# Patient Record
Sex: Female | Born: 1990
Health system: Southern US, Community
[De-identification: ages and names within clinical notes are randomized; demographics above are authoritative.]

## PROBLEM LIST (undated history)

## (undated) DIAGNOSIS — IMO0002 Reserved for concepts with insufficient information to code with codable children: Secondary | ICD-10-CM

## (undated) DIAGNOSIS — K219 Gastro-esophageal reflux disease without esophagitis: Secondary | ICD-10-CM

## (undated) DIAGNOSIS — I7300 Raynaud's syndrome without gangrene: Secondary | ICD-10-CM

## (undated) DIAGNOSIS — N390 Urinary tract infection, site not specified: Secondary | ICD-10-CM

## (undated) DIAGNOSIS — F419 Anxiety disorder, unspecified: Secondary | ICD-10-CM

## (undated) DIAGNOSIS — D839 Common variable immunodeficiency, unspecified: Secondary | ICD-10-CM

## (undated) DIAGNOSIS — J45909 Unspecified asthma, uncomplicated: Secondary | ICD-10-CM

## (undated) DIAGNOSIS — D693 Immune thrombocytopenic purpura: Secondary | ICD-10-CM

## (undated) DIAGNOSIS — T7840XA Allergy, unspecified, initial encounter: Secondary | ICD-10-CM

## (undated) DIAGNOSIS — M1990 Unspecified osteoarthritis, unspecified site: Secondary | ICD-10-CM

## (undated) DIAGNOSIS — K921 Melena: Secondary | ICD-10-CM

## (undated) DIAGNOSIS — B019 Varicella without complication: Secondary | ICD-10-CM

## (undated) DIAGNOSIS — J069 Acute upper respiratory infection, unspecified: Secondary | ICD-10-CM

## (undated) DIAGNOSIS — M329 Systemic lupus erythematosus, unspecified: Secondary | ICD-10-CM

## (undated) HISTORY — DX: Raynaud's syndrome without gangrene: I73.00

## (undated) HISTORY — DX: Common variable immunodeficiency, unspecified: D83.9

## (undated) HISTORY — DX: Gastro-esophageal reflux disease without esophagitis: K21.9

## (undated) HISTORY — DX: Varicella without complication: B01.9

## (undated) HISTORY — DX: Systemic lupus erythematosus, unspecified: M32.9

## (undated) HISTORY — DX: Immune thrombocytopenic purpura: D69.3

## (undated) HISTORY — DX: Reserved for concepts with insufficient information to code with codable children: IMO0002

## (undated) HISTORY — DX: Melena: K92.1

## (undated) HISTORY — DX: Allergy, unspecified, initial encounter: T78.40XA

## (undated) HISTORY — DX: Urinary tract infection, site not specified: N39.0

## (undated) HISTORY — DX: Unspecified osteoarthritis, unspecified site: M19.90

## (undated) HISTORY — DX: Acute upper respiratory infection, unspecified: J06.9

## (undated) HISTORY — DX: Unspecified asthma, uncomplicated: J45.909

## (undated) HISTORY — DX: Anxiety disorder, unspecified: F41.9

---
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
## 2011-01-03 DIAGNOSIS — M3219 Other organ or system involvement in systemic lupus erythematosus: Secondary | ICD-10-CM

## 2011-01-03 DIAGNOSIS — G053 Encephalitis and encephalomyelitis in diseases classified elsewhere: Secondary | ICD-10-CM

## 2011-01-03 HISTORY — DX: Other organ or system involvement in systemic lupus erythematosus: M32.19

## 2011-01-03 HISTORY — DX: Encephalitis and encephalomyelitis in diseases classified elsewhere: G05.3

## 2012-01-03 DIAGNOSIS — Z9484 Stem cells transplant status: Secondary | ICD-10-CM

## 2012-01-03 HISTORY — DX: Stem cells transplant status: Z94.84

## 2016-01-03 DIAGNOSIS — M869 Osteomyelitis, unspecified: Secondary | ICD-10-CM

## 2016-01-03 HISTORY — DX: Osteomyelitis, unspecified: M86.9

## 2017-01-02 HISTORY — PX: ORIF ANKLE FRACTURE: SUR919

## 2018-01-02 HISTORY — PX: HARDWARE REMOVAL: SHX979

## 2018-01-02 HISTORY — PX: CYST EXCISION: SHX5701

## 2018-02-27 DIAGNOSIS — K635 Polyp of colon: Secondary | ICD-10-CM | POA: Insufficient documentation

## 2018-02-27 HISTORY — DX: Polyp of colon: K63.5

## 2018-02-28 DIAGNOSIS — K635 Polyp of colon: Secondary | ICD-10-CM

## 2018-02-28 DIAGNOSIS — K449 Diaphragmatic hernia without obstruction or gangrene: Secondary | ICD-10-CM

## 2018-02-28 HISTORY — PX: ESOPHAGOGASTRODUODENOSCOPY: SHX1529

## 2018-02-28 HISTORY — DX: Diaphragmatic hernia without obstruction or gangrene: K44.9

## 2018-02-28 HISTORY — PX: COLONOSCOPY: SHX174

## 2018-02-28 HISTORY — DX: Polyp of colon: K63.5

## 2019-06-25 NOTE — Progress Notes (Signed)
New Patient Note  RE: Shirley Carr MRN: [id number] DOB: 1990/08/09 Date of Office Visit: 06/26/2019  Referring provider: No ref. provider found Primary care provider: Patient, No Pcp Per  Chief Complaint: Establish Care  History of Present Illness: I had the pleasure of seeing Shirley Carr for initial evaluation at the [facility] [place] on 06/26/2019. She is a 29 y.o. female, who is self-referred here for the evaluation of establish care. Up to date with COVID-19 vaccine: no  Patient was being followed by allergy/immunology in [place] for CVID and currently on Hizentra weekly injections 13gm for the past 6 months with good benefit. Last injection on 06/22/2019 and has no medications left. No reactions and administers at home.   Patient has history multiple infections including sinus infection, pneumonia, ear infections, meningitis. Denies any GI infections, skin infections/abscesses. Patient has no history of opportunistic infections including fungal infections, viral infections.  Patient labs show immunoglobulin levels  On 07/09/2018 IgG 459, IgA <40, IgM 28. Patient reports 0 antibiotic use in the last 12 months and 1 hospital admissions for a foot infection in September 2018. Patient does not have any secondary causes of immunodeficiency including chronic steroid use, diabetes mellitus, protein losing enteropathy, renal or hepatic dysfunction, history of cancer or irradiation or history of HIV, hepatitis B or C.  Class switched memory B cell (CD27+/IgD-/IgM-) low.  Repeat IgG in February 2021 - IgG 875.   Patient has lupus and was treated with rituximab in 2012. Patient is on plaquenil 436m now with good control. Scheduled to see rheumatology in [place] No history of blood clots.   CT chest in January 2020 which was normal - in process of obtaining records.  Spirometry end of last year was normal.  In February 2020 patient had EGD and colonoscopy and was found to have  precancerous polyps.  This was done due to blood in the stool.  Other medical history includes ITP in 2011 treated with steroids, IVIG, Rituximab x 4 weeks.   Rhinitis: Patient was on allergy injections for about 2 months but then recent testing in 2020 showed no environmental allergy sensitivities. She reports symptoms of rhinitis and PND mainly in the mornings. She has used zyrtec with some improvement in symptoms. Previous work up includes: skin prick testing in 2020 and immunocap which was all negative.  Assessment and Plan: KYenyis a 29y.o. female with: CVID (common variable immunodeficiency) ([place] Diagnosed with CVID in 2020 and has been on Hizentra 13gm weekly for the past 6 months with good benefit. Initially had some fatigue and headaches. Last injection on 06/22/2019. Pretreatment levels on 07/09/2018 IgG 459, IgA <40, IgM 28. History of frequent sinusitis, 1 pneumonia, ear infections, meningitis, foot infection requiring hospitalization. 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 CT chest in January 2020 - in process of obtaining records. History of ITP in 20211, diagnosed with lupus and on Plaquenil. No history of blood clots. Last IgG in February 2021 was 875.   Continue with Hizentra 13gm weekly injections - will start [place].   Discussed the risks and benefits of IgG replacement therapy.   If interested in the future we can also try to switch over to less frequent SQ injections such as Hyquvia.   Get bloodwork and will need bloodwork every 3-4 months to monitor immunoglobulin levels, CBC diff, CMP, ESR and CRP.   Keep track of infections.  History of asthma History of asthma and last albuterol use in 2013.  Today's  spirometry was normal.  Monitor symptoms.   Chronic rhinitis PND symptoms in the mornings. Patient had skin prick testing and bloodwork in 2020 which was negative to environmental allergens. Interestingly though she had positive testing beforehand and was  on immunotherapy for 2 short months.   May use over the counter antihistamines such as Zyrtec (cetirizine), Claritin (loratadine), Allegra (fexofenadine), or Xyzal (levocetirizine) daily as needed.  If still having PND, then will start a nasal spray next.  May use Nasacort 1 spray per nostril 1-2 times a day as needed. Sample given.   Return in about 6 months (around 12/26/2019).  Lab Orders     CBC with Differential/Platelet     Comprehensive metabolic panel     IgG, IgA, IgM     Sedimentation rate     C-reactive protein  Other allergy screening: Asthma: yes  No inhaler use since 2013 Food allergy: no Medication allergy: yes  Pseudoephedrine - joint swelling Hymenoptera allergy: no Urticaria: no Eczema:no  Diagnostics: Spirometry:  Tracings reviewed. Her effort: Good reproducible efforts. FVC: 4.55L FEV1: 3.68L, 99% predicted FEV1/FVC ratio: 81% Interpretation: Spirometry consistent with normal pattern.  Please see scanned spirometry results for details.   Past Medical History: Patient Active Problem List   Diagnosis Date Noted  . CVID (common variable immunodeficiency) ([place]) 06/26/2019  . Chronic rhinitis 06/26/2019  . History of asthma 06/26/2019   Past Medical History:  Diagnosis Date  . Asthma   . Recurrent upper respiratory infection (URI)    Past Surgical History: History reviewed. No pertinent surgical history. Medication List:  Current Outpatient Medications  Medication Sig Dispense Refill  . cetirizine (ZYRTEC) 10 MG tablet Take 10 mg by mouth daily.    . clobetasol (TEMOVATE) 0.05 % external solution Apply topically.    . hydroxychloroquine (PLAQUENIL) 200 MG tablet Take 400 mg by mouth daily.    . Immune Globulin, Human, (HIZENTRA) 1 GM/5ML SOSY Inject 13 g into the skin once a week.    . Norgestimate-Ethinyl Estradiol Triphasic 0.18/0.215/0.25 MG-35 MCG tablet Take 1 tablet by mouth daily.    Marland Kitchen omeprazole (PRILOSEC) 20 MG capsule Take 20 mg by  mouth daily.     No current facility-administered medications for this visit.   Allergies: Allergies  Allergen Reactions  . Pseudoephedrine    Social History: Social History   Socioeconomic History  . Marital status: Single    Spouse name: Not on file  . Number of children: Not on file  . Years of education: Not on file  . Highest education level: Not on file  Occupational History  . Not on file  Tobacco Use  . Smoking status: Never Smoker  . Smokeless tobacco: Never Used  Substance and Sexual Activity  . Alcohol use: Yes  . Drug use: Never  . Sexual activity: Not on file  Other Topics Concern  . Not on file  Social History Narrative  . Not on file   Social Determinants of Health   Financial Resource Strain:   . Difficulty of Paying Living Expenses:   Food Insecurity:   . Worried About Charity fundraiser in the Last Year:   . Arboriculturist in the Last Year:   Transportation Needs:   . Film/video editor (Medical):   Marland Kitchen Lack of Transportation (Non-Medical):   Physical Activity:   . Days of Exercise per Week:   . Minutes of Exercise per Session:   Stress:   . Feeling of Stress :  Social Connections:   . Frequency of Communication with Friends and Family:   . Frequency of Social Gatherings with Friends and Family:   . Attends Religious Services:   . Active Member of Clubs or Organizations:   . Attends Archivist Meetings:   Marland Kitchen Marital Status:    Lives in an apartment. Smoking: denies Occupation: loan closer  Environmental HistoryFreight forwarder in the house: no Carpet in the family room: no Carpet in the bedroom: yes Heating: electric Cooling: central Pet: no  Family History: Family History  Problem Relation Age of Onset  . Urticaria Mother   . Eczema Brother   . Allergic rhinitis Neg Hx   . Asthma Neg Hx    Immunodeficiency - No Brother had adolescent RA.  Review of Systems  Constitutional: Negative for appetite  change, chills, fever and unexpected weight change.  HENT: Negative for congestion and rhinorrhea.   Eyes: Negative for itching.  Respiratory: Negative for cough, chest tightness, shortness of breath and wheezing.   Cardiovascular: Negative for chest pain.  Gastrointestinal: Negative for abdominal pain.  Genitourinary: Negative for difficulty urinating.  Skin: Negative for rash.  Neurological: Negative for headaches.   Objective: BP 126/90 (BP Location: Left Arm, Patient Position: Sitting, Cuff Size: Large)   Pulse 88   Temp 97.6 F (36.4 C) (Temporal)   Resp 18   Ht 5' 8.54" (1.741 m)   Wt 299 lb (135.6 kg)   SpO2 100%   BMI 44.75 kg/m  Body mass index is 44.75 kg/m. Physical Exam Vitals and nursing note reviewed.  Constitutional:      Appearance: Normal appearance. She is well-developed.  HENT:     Head: Normocephalic and atraumatic.     Right Ear: External ear normal.     Left Ear: External ear normal.     Nose: Nose normal.     Mouth/Throat:     Mouth: Mucous membranes are moist.     Pharynx: Oropharynx is clear.  Eyes:     Conjunctiva/sclera: Conjunctivae normal.  Cardiovascular:     Rate and Rhythm: Normal rate and regular rhythm.     Heart sounds: Normal heart sounds. No murmur heard.  No friction rub. No gallop.   Pulmonary:     Effort: Pulmonary effort is normal.     Breath sounds: Normal breath sounds. No wheezing, rhonchi or rales.  Abdominal:     Palpations: Abdomen is soft.  Musculoskeletal:     Cervical back: Neck supple.  Skin:    General: Skin is warm.     Findings: No rash.  Neurological:     Mental Status: She is alert and oriented to person, place, and time.  Psychiatric:        Behavior: Behavior normal.    The plan was reviewed with the patient/family, and all questions/concerned were addressed.  It was my pleasure to see Eric today and participate in her care. Please feel free to contact me with any questions or  concerns.  Sincerely,  Rexene Alberts, DO Allergy & Immunology  [facility] office: [phone] [facility] office: [place] office: [phone]

## 2019-06-26 ENCOUNTER — Encounter: Payer: Self-pay | Admitting: Allergy

## 2019-06-26 ENCOUNTER — Ambulatory Visit (INDEPENDENT_AMBULATORY_CARE_PROVIDER_SITE_OTHER): Payer: BC Managed Care – PPO | Admitting: Allergy

## 2019-06-26 ENCOUNTER — Other Ambulatory Visit: Payer: Self-pay

## 2019-06-26 VITALS — BP 126/90 | HR 88 | Temp 97.6°F | Resp 18 | Ht 68.54 in | Wt 299.0 lb

## 2019-06-26 DIAGNOSIS — Z8709 Personal history of other diseases of the respiratory system: Secondary | ICD-10-CM | POA: Insufficient documentation

## 2019-06-26 DIAGNOSIS — J310 Chronic rhinitis: Secondary | ICD-10-CM | POA: Insufficient documentation

## 2019-06-26 DIAGNOSIS — D839 Common variable immunodeficiency, unspecified: Secondary | ICD-10-CM | POA: Diagnosis not present

## 2019-06-26 HISTORY — DX: Personal history of other diseases of the respiratory system: Z87.09

## 2019-06-26 NOTE — Patient Instructions (Addendum)
CVID  Continue with Hizentra 13gm weekly injections.  Tammy will get in contact with you regarding this.  Get bloodwork.  Keep track of infections.  Read about Hyquvia or Cuvitru which are different subcutaneous IgG replacement products.  I will need the results of your CT chest.  Rhinitis:  May use over the counter antihistamines such as Zyrtec (cetirizine), Claritin (loratadine), Allegra (fexofenadine), or Xyzal (levocetirizine) daily as needed.  If still having PND, then will start a nasal spray next.  May use Nasacort 1 spray per nostril 1-2 times a day as needed. Sample given.   Follow up in 6 months or sooner if needed.  Please establish care with PCP.

## 2019-06-26 NOTE — Assessment & Plan Note (Signed)
History of asthma and last albuterol use in 2013.  Today's spirometry was normal.  Monitor symptoms.

## 2019-06-26 NOTE — Assessment & Plan Note (Signed)
PND symptoms in the mornings. Patient had skin prick testing and bloodwork in 2020 which was negative to environmental allergens. Interestingly though she had positive testing beforehand and was on immunotherapy for 2 short months.   May use over the counter antihistamines such as Zyrtec (cetirizine), Claritin (loratadine), Allegra (fexofenadine), or Xyzal (levocetirizine) daily as needed.  If still having PND, then will start a nasal spray next.  May use Nasacort 1 spray per nostril 1-2 times a day as needed. Sample given.

## 2019-06-26 NOTE — Assessment & Plan Note (Addendum)
Diagnosed with CVID in 2020 and has been on Hizentra 13gm weekly for the past 6 months with good benefit. Initially had some fatigue and headaches. Last injection on 06/22/2019. Pretreatment levels on 07/09/2018 IgG 459, IgA <40, IgM 28. History of frequent sinusitis, 1 pneumonia, ear infections, meningitis, foot infection requiring hospitalization. 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 CT chest in January 2020 - in process of obtaining records. History of ITP in 20211, diagnosed with lupus and on Plaquenil. No history of blood clots. Last IgG in February 2021 was 875.   Continue with Hizentra 13gm weekly injections - will start [place].   Discussed the risks and benefits of IgG replacement therapy.   If interested in the future we can also try to switch over to less frequent SQ injections such as Hyquvia.   Get bloodwork and will need bloodwork every 3-4 months to monitor immunoglobulin levels, CBC diff, CMP, ESR and CRP.   Keep track of infections.

## 2019-06-30 ENCOUNTER — Telehealth: Payer: Self-pay | Admitting: *Deleted

## 2019-06-30 NOTE — Telephone Encounter (Signed)
Called patient and discussed approval and submit to another pharmacy for her Hizentra. Will be in touch regarding process

## 2019-06-30 NOTE — Telephone Encounter (Signed)
-----   Message from Garnet Sierras, DO sent at 06/26/2019  1:23 PM EDT ----- Patient transferring care. Receiving at home SQ hizentra 13gm every 7 days for CVID. Last injection was on 06/22/2019. Pretreatment IgG on 07/09/2018 459, IgA <40, IgM 28.  Patients needs new PA to start Hizentra with current dose. Please submit. Thank you!

## 2019-07-02 LAB — CBC WITH DIFFERENTIAL/PLATELET
Basophils Absolute: 0 10*3/uL (ref 0.0–0.2)
Basos: 0 %
EOS (ABSOLUTE): 0.1 10*3/uL (ref 0.0–0.4)
Eos: 1 %
Hematocrit: 42.4 % (ref 34.0–46.6)
Hemoglobin: 14.2 g/dL (ref 11.1–15.9)
Immature Grans (Abs): 0 10*3/uL (ref 0.0–0.1)
Immature Granulocytes: 0 %
Lymphocytes Absolute: 1.3 10*3/uL (ref 0.7–3.1)
Lymphs: 22 %
MCH: 28 pg (ref 26.6–33.0)
MCHC: 33.5 g/dL (ref 31.5–35.7)
MCV: 84 fL (ref 79–97)
Monocytes Absolute: 0.3 10*3/uL (ref 0.1–0.9)
Monocytes: 6 %
Neutrophils Absolute: 4 10*3/uL (ref 1.4–7.0)
Neutrophils: 71 %
Platelets: 250 10*3/uL (ref 150–450)
RBC: 5.08 x10E6/uL (ref 3.77–5.28)
RDW: 13.1 % (ref 11.7–15.4)
WBC: 5.7 10*3/uL (ref 3.4–10.8)

## 2019-07-02 LAB — COMPREHENSIVE METABOLIC PANEL
ALT: 16 IU/L (ref 0–32)
AST: 20 IU/L (ref 0–40)
Albumin/Globulin Ratio: 1.7 (ref 1.2–2.2)
Albumin: 4.3 g/dL (ref 3.9–5.0)
Alkaline Phosphatase: 87 IU/L (ref 48–121)
BUN/Creatinine Ratio: 9 (ref 9–23)
BUN: 9 mg/dL (ref 6–20)
Bilirubin Total: 0.3 mg/dL (ref 0.0–1.2)
CO2: 23 mmol/L (ref 20–29)
Calcium: 9.4 mg/dL (ref 8.7–10.2)
Chloride: 101 mmol/L (ref 96–106)
Creatinine, Ser: 1.01 mg/dL — ABNORMAL HIGH (ref 0.57–1.00)
GFR calc Af Amer: 88 mL/min/{1.73_m2} (ref >59)
GFR calc non Af Amer: 76 mL/min/{1.73_m2} (ref >59)
Globulin, Total: 2.6 g/dL (ref 1.5–4.5)
Glucose: 122 mg/dL — ABNORMAL HIGH (ref 65–99)
Potassium: 4.2 mmol/L (ref 3.5–5.2)
Sodium: 137 mmol/L (ref 134–144)
Total Protein: 6.9 g/dL (ref 6.0–8.5)

## 2019-07-02 LAB — IGG, IGA, IGM
IgA/Immunoglobulin A, Serum: 31 mg/dL — ABNORMAL LOW (ref 87–352)
IgG (Immunoglobin G), Serum: 1005 mg/dL (ref 586–1602)
IgM (Immunoglobulin M), Srm: 20 mg/dL — ABNORMAL LOW (ref 26–217)

## 2019-07-02 LAB — SEDIMENTATION RATE: Sed Rate: 14 mm/hr (ref 0–32)

## 2019-07-02 LAB — C-REACTIVE PROTEIN: CRP: 24 mg/L — ABNORMAL HIGH (ref 0–10)

## 2019-07-03 NOTE — Addendum Note (Signed)
Addended by: Garnet Sierras on: 07/03/2019 02:24 PM   Modules accepted: Orders

## 2019-07-13 DIAGNOSIS — D839 Common variable immunodeficiency, unspecified: Secondary | ICD-10-CM | POA: Diagnosis not present

## 2019-07-13 DIAGNOSIS — L03113 Cellulitis of right upper limb: Secondary | ICD-10-CM | POA: Diagnosis not present

## 2019-07-15 ENCOUNTER — Encounter: Payer: Self-pay | Admitting: Allergy

## 2019-07-15 NOTE — Progress Notes (Signed)
Received CD rom and printout of recent imaging.  CT neck 05/14/2018 - unremarkable. CXR 04/30/2018 - no acute findings

## 2019-07-21 ENCOUNTER — Other Ambulatory Visit: Payer: Self-pay | Admitting: Allergy

## 2019-07-22 DIAGNOSIS — L03113 Cellulitis of right upper limb: Secondary | ICD-10-CM | POA: Diagnosis not present

## 2019-08-10 DIAGNOSIS — D839 Common variable immunodeficiency, unspecified: Secondary | ICD-10-CM | POA: Diagnosis not present

## 2019-09-11 DIAGNOSIS — D839 Common variable immunodeficiency, unspecified: Secondary | ICD-10-CM | POA: Diagnosis not present

## 2019-09-16 DIAGNOSIS — D801 Nonfamilial hypogammaglobulinemia: Secondary | ICD-10-CM | POA: Diagnosis not present

## 2019-09-16 DIAGNOSIS — M329 Systemic lupus erythematosus, unspecified: Secondary | ICD-10-CM | POA: Diagnosis not present

## 2019-09-18 DIAGNOSIS — Z79899 Other long term (current) drug therapy: Secondary | ICD-10-CM | POA: Diagnosis not present

## 2019-09-18 DIAGNOSIS — M069 Rheumatoid arthritis, unspecified: Secondary | ICD-10-CM | POA: Diagnosis not present

## 2019-10-12 DIAGNOSIS — D839 Common variable immunodeficiency, unspecified: Secondary | ICD-10-CM | POA: Diagnosis not present

## 2019-10-26 DIAGNOSIS — D839 Common variable immunodeficiency, unspecified: Secondary | ICD-10-CM | POA: Diagnosis not present

## 2019-11-02 DIAGNOSIS — D839 Common variable immunodeficiency, unspecified: Secondary | ICD-10-CM | POA: Diagnosis not present

## 2019-11-09 DIAGNOSIS — D839 Common variable immunodeficiency, unspecified: Secondary | ICD-10-CM | POA: Diagnosis not present

## 2019-11-23 DIAGNOSIS — D839 Common variable immunodeficiency, unspecified: Secondary | ICD-10-CM | POA: Diagnosis not present

## 2019-11-30 DIAGNOSIS — D839 Common variable immunodeficiency, unspecified: Secondary | ICD-10-CM | POA: Diagnosis not present

## 2019-12-08 DIAGNOSIS — D839 Common variable immunodeficiency, unspecified: Secondary | ICD-10-CM | POA: Diagnosis not present

## 2019-12-09 LAB — IGG, IGA, IGM
IgA/Immunoglobulin A, Serum: 30 mg/dL — ABNORMAL LOW (ref 87–352)
IgG (Immunoglobin G), Serum: 1102 mg/dL (ref 586–1602)
IgM (Immunoglobulin M), Srm: 22 mg/dL — ABNORMAL LOW (ref 26–217)

## 2019-12-09 LAB — CBC WITH DIFFERENTIAL/PLATELET
Basophils Absolute: 0 10*3/uL (ref 0.0–0.2)
Basos: 1 %
EOS (ABSOLUTE): 0.1 10*3/uL (ref 0.0–0.4)
Eos: 1 %
Hematocrit: 42.7 % (ref 34.0–46.6)
Hemoglobin: 14.4 g/dL (ref 11.1–15.9)
Immature Grans (Abs): 0 10*3/uL (ref 0.0–0.1)
Immature Granulocytes: 0 %
Lymphocytes Absolute: 1.9 10*3/uL (ref 0.7–3.1)
Lymphs: 25 %
MCH: 28.4 pg (ref 26.6–33.0)
MCHC: 33.7 g/dL (ref 31.5–35.7)
MCV: 84 fL (ref 79–97)
Monocytes Absolute: 0.6 10*3/uL (ref 0.1–0.9)
Monocytes: 9 %
Neutrophils Absolute: 4.8 10*3/uL (ref 1.4–7.0)
Neutrophils: 64 %
Platelets: 287 10*3/uL (ref 150–450)
RBC: 5.07 x10E6/uL (ref 3.77–5.28)
RDW: 12.7 % (ref 11.7–15.4)
WBC: 7.4 10*3/uL (ref 3.4–10.8)

## 2019-12-09 LAB — C-REACTIVE PROTEIN: CRP: 17 mg/L — ABNORMAL HIGH (ref 0–10)

## 2019-12-09 LAB — SEDIMENTATION RATE: Sed Rate: 6 mm/h (ref 0–32)

## 2019-12-10 DIAGNOSIS — D839 Common variable immunodeficiency, unspecified: Secondary | ICD-10-CM | POA: Diagnosis not present

## 2019-12-24 DIAGNOSIS — D839 Common variable immunodeficiency, unspecified: Secondary | ICD-10-CM | POA: Diagnosis not present

## 2019-12-30 ENCOUNTER — Ambulatory Visit (INDEPENDENT_AMBULATORY_CARE_PROVIDER_SITE_OTHER): Payer: BC Managed Care – PPO | Admitting: Allergy

## 2019-12-30 ENCOUNTER — Other Ambulatory Visit: Payer: Self-pay

## 2019-12-30 ENCOUNTER — Encounter: Payer: Self-pay | Admitting: Allergy

## 2019-12-30 VITALS — BP 110/80 | HR 101 | Temp 97.9°F | Resp 20

## 2019-12-30 DIAGNOSIS — Z8709 Personal history of other diseases of the respiratory system: Secondary | ICD-10-CM

## 2019-12-30 DIAGNOSIS — D839 Common variable immunodeficiency, unspecified: Secondary | ICD-10-CM | POA: Diagnosis not present

## 2019-12-30 DIAGNOSIS — J310 Chronic rhinitis: Secondary | ICD-10-CM | POA: Diagnosis not present

## 2019-12-30 MED ORDER — AZELASTINE HCL 0.1 % NA SOLN: 1.0000 | Freq: Two times a day (BID) | NASAL | 5 refills | Status: DC | PRN

## 2019-12-30 NOTE — Progress Notes (Signed)
Follow Up Note  RE: Shirley Carr MRN: [id number] DOB: 1990/05/27 Date of Office Visit: 12/30/2019  Referring provider: No ref. provider found Primary care provider: Patient, No Pcp Per  Chief Complaint: Allergies (Runny nose)  History of Present Illness: I had the pleasure of seeing Shirley Carr for a follow up visit at the [facility] [place] on 12/31/2019. She is a 29 y.o. female, who is being followed for CVID, history of asthma and chronic rhinitis. Her previous allergy office visit was on 06/26/2019 with Dr. Maudie Mercury. Today is a regular follow up visit.  CVID (common variable immunodeficiency) Patient doing weekly Hizentra injections (6 injection sites on abdominal area and inner thigh) and doing well. No infections since the last visit and no antibiotics use.   Still on plaquenil for lupus. Did not get flu or Covid-19 vaccine yet.   Denies any fevers, chills, bruising, bleeding, lymphadenopathy.  History of asthma Denies any SOB, coughing, wheezing, chest tightness, nocturnal awakenings, ER/urgent care visits or prednisone use since the last visit.  Chronic rhinitis Having nasal drainage.  Currently taking zyrtec daily.  Assessment and Plan: [name] is a 29 y.o. female with: CVID (common variable immunodeficiency) ([place]) Past history - Diagnosed with CVID in 2020 and has been on Hizentra 13gm weekly for the past 6 months with good benefit. Initially had some fatigue and headaches. Last injection on 06/22/2019. Pretreatment levels on 07/09/2018 IgG 459, IgA <40, IgM 28. History of frequent sinusitis, 1 pneumonia, ear infections, meningitis, foot infection requiring hospitalization. 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 CT chest in January 2020 - in process of obtaining records. History of ITP in 20211, diagnosed with lupus and on Plaquenil. No history of blood clots. Last IgG in February 2021 was 875.  Interim history - no infections/antibiotics since last  visit. 12/08/2019 IgG 1,102.  Continue with Hizentra 13gm weekly injections.  Will try to switch to one with less frequent dosing - Cuvitru or Hyquvia depending on insurance coverage.   Get bloodwork every 4 months.   Get the day before due for infusion to get trough level.   Keep track of infections.  Patient will look for CT chest results.   Recommend annual flu vaccine; and COVID-19 vaccines.   History of asthma Past history - last albuterol use in 2013. 2021 spirometry was normal. Interim history - asymptomatic, no albuterol use.  Monitor symptoms.   Chronic rhinitis Past history - PND symptoms in the mornings. Patient had skin prick testing and bloodwork in 2020 which was negative to environmental allergens. Interestingly though she had positive testing beforehand and was on immunotherapy for 2 short months.  Interim history - still having nasal drainage.  May use over the counter antihistamines such as Zyrtec (cetirizine), Claritin (loratadine), Allegra (fexofenadine), or Xyzal (levocetirizine) daily as needed.  May use azelastine nasal spray 1-2 sprays per nostril twice a day as needed for runny nose/drainage. If no benefit, will try ipratropium nasal spray next.   Return in about 6 months (around 06/29/2020).  Encouraged establishing care with a PCP.   Meds ordered this encounter  Medications  . azelastine (ASTELIN) 0.1 % nasal spray    Sig: Place 1-2 sprays into both nostrils 2 (two) times daily as needed for rhinitis. Use in each nostril as directed    Dispense:  30 mL    Refill:  5    Lab Orders     CBC with Differential/Platelet     IgG, IgA, IgM  Sedimentation rate     C-reactive protein     Comprehensive metabolic panel  Diagnostics: None.  Medication List:  Current Outpatient Medications  Medication Sig Dispense Refill  . azelastine (ASTELIN) 0.1 % nasal spray Place 1-2 sprays into both nostrils 2 (two) times daily as needed for rhinitis. Use in  each nostril as directed 30 mL 5  . cetirizine (ZYRTEC) 10 MG tablet Take 10 mg by mouth daily.    . clobetasol (TEMOVATE) 0.05 % external solution Apply topically.    . hydroxychloroquine (PLAQUENIL) 200 MG tablet Take 400 mg by mouth daily.    . Immune Globulin, Human, (HIZENTRA) 1 GM/5ML SOSY Inject 13 g into the skin once a week.    . Norgestimate-Ethinyl Estradiol Triphasic 0.18/0.215/0.25 MG-35 MCG tablet Take 1 tablet by mouth daily.     No current facility-administered medications for this visit.   Allergies: Allergies  Allergen Reactions  . Pseudoephedrine    I reviewed her past medical history, social history, family history, and environmental history and no significant changes have been reported from her previous visit.  Review of Systems  Constitutional: Negative for appetite change, chills, fever and unexpected weight change.  HENT: Positive for postnasal drip and rhinorrhea. Negative for congestion.   Eyes: Negative for itching.  Respiratory: Negative for cough, chest tightness, shortness of breath and wheezing.   Cardiovascular: Negative for chest pain.  Gastrointestinal: Negative for abdominal pain.  Genitourinary: Negative for difficulty urinating.  Skin: Negative for rash.  Allergic/Immunologic: Negative for environmental allergies and food allergies.  Neurological: Negative for headaches.   Objective: BP 110/80   Pulse (!) 101   Temp 97.9 F (36.6 C) (Temporal)   Resp 20   SpO2 98%  There is no height or weight on file to calculate BMI. Physical Exam Vitals and nursing note reviewed.  Constitutional:      Appearance: Normal appearance. She is well-developed.  HENT:     Head: Normocephalic and atraumatic.     Right Ear: Tympanic membrane and external ear normal.     Left Ear: Tympanic membrane and external ear normal.     Nose: Nose normal.     Mouth/Throat:     Mouth: Mucous membranes are moist.     Pharynx: Oropharynx is clear.  Eyes:      Conjunctiva/sclera: Conjunctivae normal.  Cardiovascular:     Rate and Rhythm: Normal rate and regular rhythm.     Heart sounds: Normal heart sounds. No murmur heard. No friction rub. No gallop.   Pulmonary:     Effort: Pulmonary effort is normal.     Breath sounds: Normal breath sounds. No wheezing, rhonchi or rales.  Musculoskeletal:     Cervical back: Neck supple.  Skin:    General: Skin is warm.     Findings: No rash.  Neurological:     Mental Status: She is alert and oriented to person, place, and time.  Psychiatric:        Mood and Affect: Mood normal.        Behavior: Behavior normal.    Previous notes and tests were reviewed. The plan was reviewed with the patient/family, and all questions/concerned were addressed.  It was my pleasure to see Shirley Carr today and participate in her care. Please feel free to contact me with any questions or concerns.  Sincerely,  Wyline Mood, DO Allergy & Immunology  [facility] office: [phone] [facility] office: [phone]

## 2019-12-30 NOTE — Patient Instructions (Addendum)
CVID  Continue with Hizentra 13gm weekly injections.  Will try to switch you to one with less frequent dosing - Cuvitru or Hyquvia.   Tammy will get in contact with you regarding this.  Get bloodwork every 4 months.   Get the day before you are due for your infusion.   Keep track of infections.  I will need the results of your CT chest.  Rhinitis:  May use over the counter antihistamines such as Zyrtec (cetirizine), Claritin (loratadine), Allegra (fexofenadine), or Xyzal (levocetirizine) daily as needed.  May use azelastine nasal spray 1-2 sprays per nostril twice a day as needed for runny nose/drainage.  Follow up in 6 months or sooner if needed.  Please establish care with PCP.

## 2019-12-31 ENCOUNTER — Encounter: Payer: Self-pay | Admitting: Allergy

## 2019-12-31 DIAGNOSIS — D839 Common variable immunodeficiency, unspecified: Secondary | ICD-10-CM | POA: Diagnosis not present

## 2019-12-31 NOTE — Assessment & Plan Note (Signed)
Past history - PND symptoms in the mornings. Patient had skin prick testing and bloodwork in 2020 which was negative to environmental allergens. Interestingly though she had positive testing beforehand and was on immunotherapy for 2 short months.  Interim history - still having nasal drainage.  May use over the counter antihistamines such as Zyrtec (cetirizine), Claritin (loratadine), Allegra (fexofenadine), or Xyzal (levocetirizine) daily as needed.  May use azelastine nasal spray 1-2 sprays per nostril twice a day as needed for runny nose/drainage. If no benefit, will try ipratropium nasal spray next.

## 2019-12-31 NOTE — Assessment & Plan Note (Signed)
Past history - last albuterol use in 2013. 2021 spirometry was normal. Interim history - asymptomatic, no albuterol use.  Monitor symptoms.

## 2019-12-31 NOTE — Assessment & Plan Note (Signed)
Past history - Diagnosed with CVID in 2020 and has been on Hizentra 13gm weekly for the past 6 months with good benefit. Initially had some fatigue and headaches. Last injection on 06/22/2019. Pretreatment levels on 07/09/2018 IgG 459, IgA <40, IgM 28. History of frequent sinusitis, 1 pneumonia, ear infections, meningitis, foot infection requiring hospitalization. 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 CT chest in January 2020 - in process of obtaining records. History of ITP in [address], diagnosed with lupus and on Plaquenil. No history of blood clots. Last IgG in February 2021 was 875.  Interim history - no infections/antibiotics since last visit. 12/08/2019 IgG 1,102.  Continue with Hizentra 13gm weekly injections.  Will try to switch to one with less frequent dosing - Cuvitru or Hyquvia depending on insurance coverage.   Get bloodwork every 4 months.   Get the day before due for infusion to get trough level.   Keep track of infections.  Patient will look for CT chest results.   Recommend annual flu vaccine; and COVID-19 vaccines.

## 2020-01-07 DIAGNOSIS — D839 Common variable immunodeficiency, unspecified: Secondary | ICD-10-CM | POA: Diagnosis not present

## 2020-01-15 ENCOUNTER — Telehealth: Payer: Self-pay | Admitting: *Deleted

## 2020-01-15 NOTE — Telephone Encounter (Signed)
Spoke to patient and she advised her medical coverage has not changed but gave express scripts info for PBM

## 2020-01-15 NOTE — Telephone Encounter (Signed)
Trying to obtain approval for Hyqvia but Ins advised not active member. L/m for patient to contact me to verify Ins info

## 2020-01-15 NOTE — Telephone Encounter (Signed)
-----   Message from Garnet Sierras, DO sent at 12/30/2019 12:57 PM EST ----- Uziel Covault, can you check if Cuvitru or Tami Ribas is covered for this patient? I'm trying to switch her from Hizentra weekly doing to something she would need to infuse less frequently. Thank you.

## 2020-01-21 DIAGNOSIS — D839 Common variable immunodeficiency, unspecified: Secondary | ICD-10-CM | POA: Diagnosis not present

## 2020-01-26 NOTE — Telephone Encounter (Signed)
Spoke to patient and advised Rx to [facility], and fax to [facility] for copay for change to [facility]

## 2020-01-26 NOTE — Telephone Encounter (Signed)
L/M for patient to advise per PBM no PA reqd and new Rx to [facility] for Hyqvia and enrollment to [employer] for copay for same

## 2020-01-28 DIAGNOSIS — D839 Common variable immunodeficiency, unspecified: Secondary | ICD-10-CM | POA: Diagnosis not present

## 2020-01-30 ENCOUNTER — Telehealth: Payer: Self-pay

## 2020-01-30 NOTE — Telephone Encounter (Signed)
Hey Dr Havery Moros this pt is wishing to schedule an appt with a new GI provider for a repeat colonoscopy, pt states she is due to have one every two years, last colonoscopy was done on 06/06/2018. I will send the records to you for review, please advise on scheduling.

## 2020-02-02 NOTE — Telephone Encounter (Signed)
Records reviewed: -  Colonoscopy 02/28/18 - Dr. Alison Murray - normal TI and colon, 69mm polyp removed c/w adenoma. Rest of colon and TI biopsied and normal  EGD 02/25/18 - small hiatal hernia, mild gastritis - biopsies negative for HP and celiac disease   Her primary GI had recommended another colonoscopy in 2 years given her young age at time of diagnosis of first adenoma. We can direct book for colonoscopy at the [facility] if she wants to proceed with that based on prior physicians recommendation or she can see me in the office first to discuss, either way okay. Thanks

## 2020-02-04 DIAGNOSIS — Z01419 Encounter for gynecological examination (general) (routine) without abnormal findings: Secondary | ICD-10-CM | POA: Diagnosis not present

## 2020-02-04 DIAGNOSIS — M3219 Other organ or system involvement in systemic lupus erythematosus: Secondary | ICD-10-CM

## 2020-02-04 DIAGNOSIS — Z6841 Body Mass Index (BMI) 40.0 and over, adult: Secondary | ICD-10-CM | POA: Diagnosis not present

## 2020-02-04 HISTORY — DX: Other organ or system involvement in systemic lupus erythematosus: M32.19

## 2020-02-09 DIAGNOSIS — D693 Immune thrombocytopenic purpura: Secondary | ICD-10-CM | POA: Diagnosis not present

## 2020-02-09 DIAGNOSIS — M329 Systemic lupus erythematosus, unspecified: Secondary | ICD-10-CM | POA: Diagnosis not present

## 2020-02-09 DIAGNOSIS — N390 Urinary tract infection, site not specified: Secondary | ICD-10-CM | POA: Diagnosis not present

## 2020-02-09 DIAGNOSIS — Z79899 Other long term (current) drug therapy: Secondary | ICD-10-CM | POA: Diagnosis not present

## 2020-02-12 DIAGNOSIS — D839 Common variable immunodeficiency, unspecified: Secondary | ICD-10-CM | POA: Diagnosis not present

## 2020-02-16 DIAGNOSIS — D839 Common variable immunodeficiency, unspecified: Secondary | ICD-10-CM | POA: Diagnosis not present

## 2020-02-17 DIAGNOSIS — M1990 Unspecified osteoarthritis, unspecified site: Secondary | ICD-10-CM | POA: Insufficient documentation

## 2020-02-17 DIAGNOSIS — K589 Irritable bowel syndrome without diarrhea: Secondary | ICD-10-CM | POA: Insufficient documentation

## 2020-02-17 DIAGNOSIS — D693 Immune thrombocytopenic purpura: Secondary | ICD-10-CM | POA: Insufficient documentation

## 2020-02-17 DIAGNOSIS — I7300 Raynaud's syndrome without gangrene: Secondary | ICD-10-CM | POA: Insufficient documentation

## 2020-02-17 DIAGNOSIS — M329 Systemic lupus erythematosus, unspecified: Secondary | ICD-10-CM | POA: Insufficient documentation

## 2020-02-17 DIAGNOSIS — E669 Obesity, unspecified: Secondary | ICD-10-CM | POA: Insufficient documentation

## 2020-02-18 ENCOUNTER — Encounter: Payer: Self-pay | Admitting: Gastroenterology

## 2020-02-18 ENCOUNTER — Ambulatory Visit (INDEPENDENT_AMBULATORY_CARE_PROVIDER_SITE_OTHER): Payer: BC Managed Care – PPO | Admitting: Family Medicine

## 2020-02-18 ENCOUNTER — Other Ambulatory Visit: Payer: Self-pay

## 2020-02-18 ENCOUNTER — Encounter: Payer: Self-pay | Admitting: Family Medicine

## 2020-02-18 VITALS — BP 131/96 | HR 92 | Temp 98.4°F | Ht 67.25 in | Wt 303.0 lb

## 2020-02-18 DIAGNOSIS — Z0000 Encounter for general adult medical examination without abnormal findings: Secondary | ICD-10-CM | POA: Diagnosis not present

## 2020-02-18 DIAGNOSIS — Z1159 Encounter for screening for other viral diseases: Secondary | ICD-10-CM | POA: Diagnosis not present

## 2020-02-18 DIAGNOSIS — Z114 Encounter for screening for human immunodeficiency virus [HIV]: Secondary | ICD-10-CM | POA: Diagnosis not present

## 2020-02-18 DIAGNOSIS — M329 Systemic lupus erythematosus, unspecified: Secondary | ICD-10-CM

## 2020-02-18 DIAGNOSIS — Z7689 Persons encountering health services in other specified circumstances: Secondary | ICD-10-CM | POA: Diagnosis not present

## 2020-02-18 DIAGNOSIS — D839 Common variable immunodeficiency, unspecified: Secondary | ICD-10-CM

## 2020-02-18 DIAGNOSIS — D693 Immune thrombocytopenic purpura: Secondary | ICD-10-CM

## 2020-02-18 DIAGNOSIS — Z131 Encounter for screening for diabetes mellitus: Secondary | ICD-10-CM | POA: Diagnosis not present

## 2020-02-18 DIAGNOSIS — I7300 Raynaud's syndrome without gangrene: Secondary | ICD-10-CM

## 2020-02-18 DIAGNOSIS — D126 Benign neoplasm of colon, unspecified: Secondary | ICD-10-CM

## 2020-02-18 LAB — COMPREHENSIVE METABOLIC PANEL
ALT: 12 U/L (ref 0–35)
AST: 16 U/L (ref 0–37)
Albumin: 4.1 g/dL (ref 3.5–5.2)
Alkaline Phosphatase: 76 U/L (ref 39–117)
BUN: 10 mg/dL (ref 6–23)
CO2: 26 mEq/L (ref 19–32)
Calcium: 9.5 mg/dL (ref 8.4–10.5)
Chloride: 102 mEq/L (ref 96–112)
Creatinine, Ser: 0.87 mg/dL (ref 0.40–1.20)
GFR: 90.82 mL/min (ref >60.00)
Glucose, Bld: 118 mg/dL — ABNORMAL HIGH (ref 70–99)
Potassium: 4 mEq/L (ref 3.5–5.1)
Sodium: 137 mEq/L (ref 135–145)
Total Bilirubin: 0.3 mg/dL (ref 0.2–1.2)
Total Protein: 7.2 g/dL (ref 6.0–8.3)

## 2020-02-18 LAB — URINALYSIS, ROUTINE W REFLEX MICROSCOPIC
Bilirubin Urine: NEGATIVE
Ketones, ur: NEGATIVE
Leukocytes,Ua: NEGATIVE
Nitrite: NEGATIVE
Specific Gravity, Urine: >=1.03 — AB (ref 1.000–1.030)
Urine Glucose: NEGATIVE
Urobilinogen, UA: 0.2 (ref 0.0–1.0)
pH: 6 (ref 5.0–8.0)

## 2020-02-18 LAB — LIPID PANEL
Cholesterol: 202 mg/dL — ABNORMAL HIGH (ref 0–200)
HDL: 59.6 mg/dL (ref >39.00)
NonHDL: 141.97
Total CHOL/HDL Ratio: 3
Triglycerides: 209 mg/dL — ABNORMAL HIGH (ref 0.0–149.0)
VLDL: 41.8 mg/dL — ABNORMAL HIGH (ref 0.0–40.0)

## 2020-02-18 LAB — HEMOGLOBIN A1C: Hgb A1c MFr Bld: 5.3 % (ref 4.6–6.5)

## 2020-02-18 LAB — LDL CHOLESTEROL, DIRECT: Direct LDL: 117 mg/dL

## 2020-02-18 LAB — TSH: TSH: 4.06 u[IU]/mL (ref 0.35–4.50)

## 2020-02-18 NOTE — Patient Instructions (Addendum)
Health Maintenance, Female Adopting a healthy lifestyle and getting preventive care are important in promoting health and wellness. Ask your health care provider about:  The right schedule for you to have regular tests and exams.  Things you can do on your own to prevent diseases and keep yourself healthy. What should I know about diet, weight, and exercise? Eat a healthy diet  Eat a diet that includes plenty of vegetables, fruits, low-fat dairy products, and lean protein.  Do not eat a lot of foods that are high in solid fats, added sugars, or sodium.   Maintain a healthy weight Body mass index (BMI) is used to identify weight problems. It estimates body fat based on height and weight. Your health care provider can help determine your BMI and help you achieve or maintain a healthy weight. Get regular exercise Get regular exercise. This is one of the most important things you can do for your health. Most adults should:  Exercise for at least 150 minutes each week. The exercise should increase your heart rate and make you sweat (moderate-intensity exercise).  Do strengthening exercises at least twice a week. This is in addition to the moderate-intensity exercise.  Spend less time sitting. Even light physical activity can be beneficial. Watch cholesterol and blood lipids Have your blood tested for lipids and cholesterol at 29 years of age, then have this test every 5 years. Have your cholesterol levels checked more often if:  Your lipid or cholesterol levels are high.  You are older than 29 years of age.  You are at high risk for heart disease. What should I know about cancer screening? Depending on your health history and family history, you may need to have cancer screening at various ages. This may include screening for:  Breast cancer.  Cervical cancer.  Colorectal cancer.  Skin cancer.  Lung cancer. What should I know about heart disease, diabetes, and high blood  pressure? Blood pressure and heart disease  High blood pressure causes heart disease and increases the risk of stroke. This is more likely to develop in people who have high blood pressure readings, are of African descent, or are overweight.  Have your blood pressure checked: ? Every 3-5 years if you are 18-39 years of age. ? Every year if you are 40 years old or older. Diabetes Have regular diabetes screenings. This checks your fasting blood sugar level. Have the screening done:  Once every three years after age 40 if you are at a normal weight and have a low risk for diabetes.  More often and at a younger age if you are overweight or have a high risk for diabetes. What should I know about preventing infection? Hepatitis B If you have a higher risk for hepatitis B, you should be screened for this virus. Talk with your health care provider to find out if you are at risk for hepatitis B infection. Hepatitis C Testing is recommended for:  Everyone born from 1945 through 1965.  Anyone with known risk factors for hepatitis C. Sexually transmitted infections (STIs)  Get screened for STIs, including gonorrhea and chlamydia, if: ? You are sexually active and are younger than 29 years of age. ? You are older than 29 years of age and your health care provider tells you that you are at risk for this type of infection. ? Your sexual activity has changed since you were last screened, and you are at increased risk for chlamydia or gonorrhea. Ask your health care provider   if you are at risk.  Ask your health care provider about whether you are at high risk for HIV. Your health care provider may recommend a prescription medicine to help prevent HIV infection. If you choose to take medicine to prevent HIV, you should first get tested for HIV. You should then be tested every 3 months for as long as you are taking the medicine. Pregnancy  If you are about to stop having your period (premenopausal) and  you may become pregnant, seek counseling before you get pregnant.  Take 400 to 800 micrograms (mcg) of folic acid every day if you become pregnant.  Ask for birth control (contraception) if you want to prevent pregnancy. Osteoporosis and menopause Osteoporosis is a disease in which the bones lose minerals and strength with aging. This can result in bone fractures. If you are 82 years old or older, or if you are at risk for osteoporosis and fractures, ask your health care provider if you should:  Be screened for bone loss.  Take a calcium or vitamin D supplement to lower your risk of fractures.  Be given hormone replacement therapy (HRT) to treat symptoms of menopause. Follow these instructions at home: Lifestyle  Do not use any products that contain nicotine or tobacco, such as cigarettes, e-cigarettes, and chewing tobacco. If you need help quitting, ask your health care provider.  Do not use street drugs.  Do not share needles.  Ask your health care provider for help if you need support or information about quitting drugs. Alcohol use  Do not drink alcohol if: ? Your health care provider tells you not to drink. ? You are pregnant, may be pregnant, or are planning to become pregnant.  If you drink alcohol: ? Limit how much you use to 0-1 drink a day. ? Limit intake if you are breastfeeding.  Be aware of how much alcohol is in your drink. In the [place] one drink equals one 12 oz bottle of beer (355 mL), one 5 oz glass of wine (148 mL), or one 1 oz glass of hard liquor (44 mL). General instructions  Schedule regular health, dental, and eye exams.  Stay current with your vaccines.  Tell your health care provider if: ? You often feel depressed. ? You have ever been abused or do not feel safe at home. Summary  Adopting a healthy lifestyle and getting preventive care are important in promoting health and wellness.  Follow your health care provider's instructions about healthy  diet, exercising, and getting tested or screened for diseases.  Follow your health care provider's instructions on monitoring your cholesterol and blood pressure. This information is not intended to replace advice given to you by your health care provider. Make sure you discuss any questions you have with your health care provider. Document Revised: 12/12/2017 Document Reviewed: 12/12/2017 Elsevier Patient Education  2021 [place].   Please help [place] help you:  We are honored you have chosen [facility] for your Primary Care home. Below you will find basic instructions that you may need to access in the future. Please help [place] help you by reading the instructions, which cover many of the frequent questions we experience.   Prescription refills and request:  -In order to allow more efficient response time, please call your pharmacy for all refills. They will forward the request electronically to [place]. This allows for the quickest possible response. Request left on a nurse line can take longer to refill, since these are checked as  time allows between office patients and other phone calls.  - refill request can take up to 3-5 working days to complete.  - If request is sent electronically and request is appropiate, it is usually completed in 1-2 business days.  - all patients will need to be seen routinely for all chronic medical conditions requiring prescription medications (see follow-up below). If you are overdue for follow up on your condition, you will be asked to make an appointment and we will call in enough medication to cover you until your appointment (up to 30 days).  - all controlled substances will require a face to face visit to request/refill.  - if you desire your prescriptions to go through a new pharmacy, and have an active script at original pharmacy, you will need to call your pharmacy and have scripts transferred to new pharmacy. This is completed between the pharmacy  locations and not by your provider.    Results: Our office handles many outgoing and incoming calls daily. If we have not contacted you within 1 week about your results, please check your mychart to see if there is a message first and if not, then contact our office.  In helping with this matter, you help decrease call volume, and therefore allow [place] to be able to respond to patients needs more efficiently.  We will always attempt to call you with results,  normal or abnormal. However, if we are unable to reach you we will send a message in your my chart with results.   Acute office visits (sick visit):  An acute visit is intended for a new problem and are scheduled in shorter time slots to allow schedule openings for patients with new problems. This is the appropriate visit to discuss a new problem. Problems will not be addressed by phone call or Echart message. Appointment is needed if requesting treatment. In order to provide you with excellent quality medical care with proper time for you to explain your problem, have an exam and receive treatment with instructions, these appointments should be limited to one new problem per visit. If you experience a new problem, in which you desire to be addressed, please make an acute office visit, we save openings on the schedule to accommodate you. Please do not save your new problem for any other type of visit, let us take care of it properly and quickly for you.   Follow up visits:  Depending on your condition(s) your provider will need to see you routinely in order to provide you with quality care and prescribe medication(s). Most chronic conditions (Example: hypertension, Diabetes, depression/anxiety... etc), require visits a couple times a year. Your provider will instruct you on proper follow up for your personal medical conditions and history. Please make certain to make follow up appointments for your condition as instructed. Failing to do so could result  in lapse in your medication treatment/refills. If you request a refill, and are overdue to be seen on a condition, we will always provide you with a 30 day script (once) to allow you time to schedule.      Yearly physical (well visit):  - Adults are recommended to be seen yearly for physicals. Check with your insurance and date of your last physical, most insurances require one calendar year between physicals. Physicals include all preventive health topics, screenings, medical exam and labs that are appropriate for gender/age and history. You may have fasting labs needed at this visit. This is a well visit (not a  sick visit), new problems should not be covered during this visit (see acute visit).  - Pediatric patients are seen more frequently when they are younger. Your provider will advise you on well child visit timing that is appropriate for your their age. - This is not a medicare wellness visit. Medicare wellness exams do not have an exam portion to the visit. Some medicare companies allow for a physical, some do not allow a yearly physical. If your medicare allows a yearly physical you can schedule the medicare wellness with our nurse Maudie Mercury and have your physical with your provider after, on the same day. Please check with insurance for your full benefits.   Late Policy/No Shows:  - all new patients should arrive 15-30 minutes earlier than appointment to allow [place] time  to  obtain all personal demographics,  insurance information and for you to complete office paperwork. - All established patients should arrive 10-15 minutes earlier than appointment time to update all information and be checked in .  - In our best efforts to run on time, if you are late for your appointment you will be asked to either reschedule or if able, we will work you back into the schedule. There will be a wait time to work you back in the schedule,  depending on availability.  - If you are unable to make it to your  appointment as scheduled, please call 24 hours ahead of time to allow [place] to fill the time slot with someone else who needs to be seen. If you do not cancel your appointment ahead of time, you may be charged a no show fee.  If you are age 61 or older, your body mass index should be between 23-30. Your Body mass index is Body mass index is 47.1 kg/m.Marland Kitchen If this is above the aforementioned range listed, you are consider overweight and if BMI > 35 you are conisdered obese, by medical definition and standards. Routine daily exercise and dietary modifications are encouraged. If you would like additional guidance on weight loss, please make an appointment for weight loss counseling - must be an appointment dedicate to weight loss counseling alone. I would be happy to help you.   If you are age 62 or younger, your body mass index should be between 19-25. Your Body mass index is Body mass index is 47.1 kg/m. Marland Kitchen If this is above the aformentioned range listed, you are consider overwieght and obese if BMI > 30, by medical definition and standards. Routine daily exercise and dietary modifications are encouraged. If you would like additional guidance on weight loss, please make an appointment for weight loss counseling - must be an appointment dedicate to weight loss counseling alone. I would be happy to help you.

## 2020-02-18 NOTE — Progress Notes (Signed)
Patient ID: Shirley Carr, female  DOB: September 22, 1990, 30 y.o.   MRN: [id number] Patient Care Team    Relationship Specialty Notifications Start End  Ma Hillock, DO PCP - General Family Medicine  02/18/20   Lahoma Rocker, MD Consulting Physician Rheumatology  02/18/20   Garnet Sierras, DO Consulting Physician Allergy  02/18/20   Marylynn Pearson, MD Consulting Physician Obstetrics and Gynecology  02/18/20   Konrad Felix, MD Referring Physician Ophthalmology  02/18/20   Havery Moros Carlota Raspberry, MD Consulting Physician Gastroenterology  02/18/20     Chief Complaint  Patient presents with  . Establish Care    No concerns; no refills;  . Annual Exam    Subjective: Shirley Carr is a 30 y.o.  female present for new patient establishment. All past medical history, surgical history, allergies, family history, immunizations, medications and social history were updated in the electronic medical record today. All recent labs, ED visits and hospitalizations within the last year were reviewed.  She has a significant medical history of common variable immune deficiency, ITP, lupus, Raynaud's and arthritis.  She was diagnosed May 2012.  She follows with rheumatology, gastroenterology, gynecology, asthma/allergy and ophthalmology.  Health maintenance:  Colonoscopy:02/28/2018 in [place]- Dr. Alison Murray Dr. Enis Gash reviewed records 02/02/2020 per EMR and approved pt for scheduling her 2 yr colonoscopy for 4 mm polyp c/w adenoma. I have placed a referral today and encouraged her to call them.  Mammogram: no fhx. Routine screen at 41 Cervical cancer screening: last pap: 02/2020,completed by:Dr. Julien Girt. Patient's last menstrual period was 01/29/2020. Immunizations: tdap UTD 2018, Influenza declined (encouraged yearly), covid series completed Infectious disease screening: HIV and  Hep C screen ordered today. DEXA:per rheumatology recs.  Assistive device: none Oxygen EQA:STMH Patient has a Dental  home. Hospitalizations/ED visits:reviewed  Depression screen [facility] 2/9 02/18/2020  Decreased Interest 0  Down, Depressed, Hopeless 0  PHQ - 2 Score 0   No flowsheet data found.     Fall Risk  02/18/2020  Falls in the past year? 0  Number falls in past yr: 0  Injury with Fall? 0  Follow up Falls evaluation completed     Immunization History  Administered Date(s) Administered  . Moderna Sars-Covid-2 Vaccination 01/09/2020, 02/06/2020  . Tdap 01/03/2016    No exam data present  Past Medical History:  Diagnosis Date  . Allergy   . Arthritis   . Asthma   . Blood in stool   . Chicken pox   . Chronic ITP (idiopathic thrombocytopenia) (HCC)   . Colon polyps 02/28/2018   Adenoma-repeat colonoscopy 2 years  . CVID (common variable immunodeficiency) ([place])   . CVID (common variable immunodeficiency) ([place])   . GERD (gastroesophageal reflux disease)   . H/O stem cell transplant ([place]) 2014  . Hiatal hernia 02/28/2018   Small hiatal hernia on endoscopy, gastritis.  Biopsies negative for H. pylori and celiac  . Lupus ([place])   . 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 Osteomyelitis ([place]) 2018   Hospitalized foot/ankle  . Raynaud's disease   . Recurrent upper respiratory infection (URI)   . UTI (urinary tract infection)    Allergies  Allergen Reactions  . Pseudoephedrine Other (See Comments)    Joint swelling   Past Surgical History:  Procedure Laterality Date  . COLONOSCOPY  02/28/2018   Adenoma-repeat colonoscopy 2 years  . CYST EXCISION Right 2020   Benign cyst excision right arm x2  . ESOPHAGOGASTRODUODENOSCOPY  02/28/2018  Small hiatal hernia, mild gastritis, biopsies negative for H. pylori and celiac  . GANGLION CYST EXCISION Right 2007   Right wrist  . HARDWARE REMOVAL Right 2020   Right ankle plate and screws removal  . ORIF ANKLE FRACTURE Right 2019   Plates and screws placed (removed in 2020)   Family History  Problem Relation Age of Onset  .  Urticaria Mother   . Asthma Mother   . Depression Mother   . Hypertension Mother   . Miscarriages / [place] Mother   . Eczema Brother   . Arthritis Brother   . Alcohol abuse Father   . Heart attack Father   . Allergic rhinitis Neg Hx    Social History   Social History Narrative   Marital status/children/pets: single   Education/employment: masters degree. Employed as a Product manager:      -smoke alarm in the home:No     - wears seatbelt: No     - Feels safe in their relationships: No    Allergies as of 02/18/2020      Reactions   Pseudoephedrine Other (See Comments)   Joint swelling      Medication List       Accurate as of February 18, 2020  5:20 PM. If you have any questions, ask your nurse or doctor.        STOP taking these medications   cetirizine 10 MG tablet Commonly known as: ZYRTEC Stopped by: Howard Pouch, DO   clobetasol 0.05 % external solution Commonly known as: TEMOVATE Stopped by: Howard Pouch, DO   Hizentra 1 GM/5ML Sosy Generic drug: Immune Globulin (Human) Stopped by: Howard Pouch, DO   Norgestimate-Ethinyl Estradiol Triphasic 0.18/0.215/0.25 MG-35 MCG tablet Stopped by: Howard Pouch, DO     TAKE these medications   azelastine 0.1 % nasal spray Commonly known as: ASTELIN Place 1-2 sprays into both nostrils 2 (two) times daily as needed for rhinitis. Use in each nostril as directed   fexofenadine 180 MG tablet Commonly known as: ALLEGRA Take 180 mg by mouth daily.   hydroxychloroquine 200 MG tablet Commonly known as: PLAQUENIL Take 400 mg by mouth daily.   HYQVIA [place] Inject 55 g into the skin.   norethindrone 0.35 MG tablet Commonly known as: MICRONOR Take 1 tablet by mouth daily.   Vitamin D 50 MCG (2000 UT) tablet Take 2,000 Units by mouth daily.       All past medical history, surgical history, allergies, family history, immunizations andmedications were updated in the EMR today and reviewed under the  history and medication portions of their EMR.     Patient was never admitted.   ROS: 14 pt review of systems performed and negative (unless mentioned in an HPI)  Objective: BP (!) 131/96   Pulse 92   Temp 98.4 F (36.9 C) (Oral)   Ht 5' 7.25" (1.708 m)   Wt (!) 303 lb (137.4 kg)   LMP 01/29/2020   SpO2 98%   BMI 47.10 kg/m  Gen: Afebrile. No acute distress. Nontoxic in appearance, well-developed, well-nourished, obese.  Very pleasant female. HENT: AT. [place]. Bilateral TM visualized and normal in appearance, normal external auditory canal. MMM, no oral lesions, adequate dentition. Bilateral nares within normal limits. Throat without erythema, ulcerations or exudates.  No cough on exam, no hoarseness on exam. Eyes:Pupils Equal Round Reactive to light, Extraocular movements intact,  Conjunctiva without redness, discharge or icterus. Neck/lymp/endocrine: Supple, no lymphadenopathy, no thyromegaly CV: RRR no  murmur, no edema, +2/4 P posterior tibialis pulses.  Chest: CTAB, no wheeze, rhonchi or crackles.  Normal respiratory effort.  Good air movement. Abd: Soft.  Obese. NTND. BS present.  No masses palpated. No hepatosplenomegaly. No rebound tenderness or guarding. Skin: No rashes, purpura or petechiae. Warm and well-perfused. Skin intact. Neuro/Msk:  Normal gait. PERLA. EOMi. Alert. Oriented x3.  Cranial nerves II through XII intact. Muscle strength 5/5 upper/lower extremity. DTRs equal bilaterally. Psych: Normal affect, dress and demeanor. Normal speech. Normal thought content and judgment.   Assessment/plan: Shirley Carr is a 30 y.o. female present for est care Establishing care with new doctor, encounter for Lupus (HCC)/Immune thrombocytopenic purpura (HCC)/Raynaud's phenomenon without gangrene/CVID (common variable immunodeficiency) ([place]) Diagnosed 05/2010. Managed by rheumatology. - Urinalysis, Routine w reflex microscopic - Comp Met (CMET) - TSH Adenomatous polyp of colon,  unspecified part of colon Dr. Havery Moros has reviewed her records and proved her to be scheduled.  I encouraged her to reach out to their office to get scheduled - Ambulatory referral to Gastroenterology Morbid obesity ([place]) -Diet and exercise modifications recommended - Lipid panel Diabetes mellitus screening - Hemoglobin A1c Encounter for screening for HIV - HIV antibody (with reflex) Need for hepatitis C screening test - Hepatitis C Antibody  Routine general medical examination at a health care facility Patient was encouraged to exercise greater than 150 minutes a week. Patient was encouraged to choose a diet filled with fresh fruits and vegetables, and lean meats. AVS provided to patient today for education/recommendation on gender specific health and safety maintenance. Colonoscopy:02/28/2018 in [place]- Dr. Alison Murray Dr. Enis Gash reviewed records 02/02/2020 per EMR and approved pt for scheduling her 2 yr colonoscopy for 4 mm polyp c/w adenoma. I have placed a referral today and encouraged her to call them.  Mammogram: no fhx. Routine screen at 41 Cervical cancer screening: last pap: 02/2020,completed by:Dr. Julien Girt. Patient's last menstrual period was 01/29/2020. Immunizations: tdap UTD 2018, Influenza declined (encouraged yearly), covid series completed Infectious disease screening: HIV and  Hep C screen ordered today. DEXA:per rheumatology recs.   Return in about 1 year (around 02/17/2021) for CPE (30 min).  Orders Placed This Encounter  Procedures  . Urinalysis, Routine w reflex microscopic  . Comp Met (CMET)  . TSH  . Hemoglobin A1c  . Lipid panel  . HIV antibody (with reflex)  . Hepatitis C Antibody  . LDL cholesterol, direct  . Ambulatory referral to Gastroenterology   No orders of the defined types were placed in this encounter.   Referral Orders     Ambulatory referral to Gastroenterology   Note is dictated utilizing voice recognition software. Although note has  been proof read prior to signing, occasional typographical errors still can be missed. If any questions arise, please do not hesitate to call for verification.  Electronically signed by: Howard Pouch, DO [place]

## 2020-02-19 LAB — HEPATITIS C ANTIBODY
Hepatitis C Ab: NONREACTIVE
SIGNAL TO CUT-OFF: 0.01 (ref <1.00)

## 2020-02-19 LAB — HIV ANTIBODY (ROUTINE TESTING W REFLEX): HIV 1&2 Ab, 4th Generation: NONREACTIVE

## 2020-02-20 ENCOUNTER — Encounter: Payer: Self-pay | Admitting: Family Medicine

## 2020-02-20 DIAGNOSIS — R823 Hemoglobinuria: Secondary | ICD-10-CM | POA: Insufficient documentation

## 2020-02-23 DIAGNOSIS — D839 Common variable immunodeficiency, unspecified: Secondary | ICD-10-CM | POA: Diagnosis not present

## 2020-02-26 DIAGNOSIS — D839 Common variable immunodeficiency, unspecified: Secondary | ICD-10-CM | POA: Diagnosis not present

## 2020-03-04 DIAGNOSIS — D839 Common variable immunodeficiency, unspecified: Secondary | ICD-10-CM | POA: Diagnosis not present

## 2020-03-08 DIAGNOSIS — D839 Common variable immunodeficiency, unspecified: Secondary | ICD-10-CM | POA: Diagnosis not present

## 2020-03-18 ENCOUNTER — Other Ambulatory Visit: Payer: Self-pay

## 2020-03-18 ENCOUNTER — Ambulatory Visit (AMBULATORY_SURGERY_CENTER): Payer: Self-pay | Admitting: *Deleted

## 2020-03-18 VITALS — Ht 67.25 in | Wt 308.0 lb

## 2020-03-18 DIAGNOSIS — Z86010 Personal history of colonic polyps: Secondary | ICD-10-CM

## 2020-03-18 NOTE — Progress Notes (Signed)
No egg or soy allergy known to patient  No issues with past sedation with any surgeries or procedures Patient denies ever being told they had issues or difficulty with intubation  No FH of Malignant Hyperthermia No diet pills per patient No home 02 use per patient  No blood thinners per patient  Pt denies issues with constipation  No A fib or A flutter  EMMI video to pt or via [place] 19 guidelines implemented in PV today with Pt and RN  Pt is fully vaccinated  for Covid   Plenvu sample given to pt.  [phone]  Due to the COVID-19 pandemic we are asking patients to follow certain guidelines.  Pt aware of COVID protocols and [facility] guidelines

## 2020-03-26 DIAGNOSIS — D839 Common variable immunodeficiency, unspecified: Secondary | ICD-10-CM | POA: Diagnosis not present

## 2020-03-27 LAB — CBC WITH DIFFERENTIAL/PLATELET
Basophils Absolute: 0 10*3/uL (ref 0.0–0.2)
Basos: 1 %
EOS (ABSOLUTE): 0.1 10*3/uL (ref 0.0–0.4)
Eos: 1 %
Hematocrit: 44.1 % (ref 34.0–46.6)
Hemoglobin: 14.7 g/dL (ref 11.1–15.9)
Immature Grans (Abs): 0 10*3/uL (ref 0.0–0.1)
Immature Granulocytes: 0 %
Lymphocytes Absolute: 1.8 10*3/uL (ref 0.7–3.1)
Lymphs: 29 %
MCH: 27.8 pg (ref 26.6–33.0)
MCHC: 33.3 g/dL (ref 31.5–35.7)
MCV: 84 fL (ref 79–97)
Monocytes Absolute: 0.4 10*3/uL (ref 0.1–0.9)
Monocytes: 7 %
Neutrophils Absolute: 3.9 10*3/uL (ref 1.4–7.0)
Neutrophils: 62 %
Platelets: 288 10*3/uL (ref 150–450)
RBC: 5.28 x10E6/uL (ref 3.77–5.28)
RDW: 12.6 % (ref 11.7–15.4)
WBC: 6.2 10*3/uL (ref 3.4–10.8)

## 2020-03-27 LAB — COMPREHENSIVE METABOLIC PANEL
ALT: 13 IU/L (ref 0–32)
AST: 14 IU/L (ref 0–40)
Albumin/Globulin Ratio: 1.6 (ref 1.2–2.2)
Albumin: 4.6 g/dL (ref 3.9–5.0)
Alkaline Phosphatase: 90 IU/L (ref 44–121)
BUN/Creatinine Ratio: 10 (ref 9–23)
BUN: 9 mg/dL (ref 6–20)
Bilirubin Total: 0.2 mg/dL (ref 0.0–1.2)
CO2: 22 mmol/L (ref 20–29)
Calcium: 9.7 mg/dL (ref 8.7–10.2)
Chloride: 101 mmol/L (ref 96–106)
Creatinine, Ser: 0.92 mg/dL (ref 0.57–1.00)
Globulin, Total: 2.8 g/dL (ref 1.5–4.5)
Glucose: 130 mg/dL — ABNORMAL HIGH (ref 65–99)
Potassium: 4 mmol/L (ref 3.5–5.2)
Sodium: 140 mmol/L (ref 134–144)
Total Protein: 7.4 g/dL (ref 6.0–8.5)
eGFR: 87 mL/min/{1.73_m2} (ref >59)

## 2020-03-27 LAB — IGG, IGA, IGM
IgA/Immunoglobulin A, Serum: 31 mg/dL — ABNORMAL LOW (ref 87–352)
IgG (Immunoglobin G), Serum: 1175 mg/dL (ref 586–1602)
IgM (Immunoglobulin M), Srm: 27 mg/dL (ref 26–217)

## 2020-03-27 LAB — C-REACTIVE PROTEIN: CRP: 12 mg/L — ABNORMAL HIGH (ref 0–10)

## 2020-03-27 LAB — SEDIMENTATION RATE: Sed Rate: 10 mm/hr (ref 0–32)

## 2020-03-29 DIAGNOSIS — D839 Common variable immunodeficiency, unspecified: Secondary | ICD-10-CM | POA: Diagnosis not present

## 2020-04-06 ENCOUNTER — Encounter: Payer: Self-pay | Admitting: Gastroenterology

## 2020-04-06 ENCOUNTER — Ambulatory Visit (AMBULATORY_SURGERY_CENTER): Payer: BC Managed Care – PPO | Admitting: Gastroenterology

## 2020-04-06 ENCOUNTER — Other Ambulatory Visit: Payer: Self-pay

## 2020-04-06 VITALS — BP 127/87 | HR 72 | Temp 97.7°F | Resp 14 | Ht 67.25 in | Wt 308.0 lb

## 2020-04-06 DIAGNOSIS — K635 Polyp of colon: Secondary | ICD-10-CM

## 2020-04-06 DIAGNOSIS — R197 Diarrhea, unspecified: Secondary | ICD-10-CM | POA: Diagnosis not present

## 2020-04-06 DIAGNOSIS — D123 Benign neoplasm of transverse colon: Secondary | ICD-10-CM

## 2020-04-06 DIAGNOSIS — Z1211 Encounter for screening for malignant neoplasm of colon: Secondary | ICD-10-CM | POA: Diagnosis not present

## 2020-04-06 DIAGNOSIS — Z86010 Personal history of colonic polyps: Secondary | ICD-10-CM

## 2020-04-06 MED ORDER — SODIUM CHLORIDE 0.9 % IV SOLN: 500.0000 mL | Freq: Once | INTRAVENOUS | Status: DC

## 2020-04-06 NOTE — Progress Notes (Signed)
pt tolerated well. VSS. awake and to recovery. Report given to RN.  

## 2020-04-06 NOTE — Patient Instructions (Signed)
Handouts Provided:  Polyps  YOU HAD AN ENDOSCOPIC PROCEDURE TODAY AT THE [facility]:   Refer to the procedure report that was given to you for any specific questions about what was found during the examination.  If the procedure report does not answer your questions, please call your gastroenterologist to clarify.  If you requested that your care partner not be given the details of your procedure findings, then the procedure report has been included in a sealed envelope for you to review at your convenience later.  YOU SHOULD EXPECT: Some feelings of bloating in the abdomen. Passage of more gas than usual.  Walking can help get rid of the air that was put into your GI tract during the procedure and reduce the bloating. If you had a lower endoscopy (such as a colonoscopy or flexible sigmoidoscopy) you may notice spotting of blood in your stool or on the toilet paper. If you underwent a bowel prep for your procedure, you may not have a normal bowel movement for a few days.  Please Note:  You might notice some irritation and congestion in your nose or some drainage.  This is from the oxygen used during your procedure.  There is no need for concern and it should clear up in a day or so.  SYMPTOMS TO REPORT IMMEDIATELY:   Following lower endoscopy (colonoscopy or flexible sigmoidoscopy):  Excessive amounts of blood in the stool  Significant tenderness or worsening of abdominal pains  Swelling of the abdomen that is new, acute  Fever of 100F or higher  For urgent or emergent issues, a gastroenterologist can be reached at any hour by calling [phone]. Do not use MyChart messaging for urgent concerns.    DIET:  We do recommend a small meal at first, but then you may proceed to your regular diet.  Drink plenty of fluids but you should avoid alcoholic beverages for 24 hours.  ACTIVITY:  You should plan to take it easy for the rest of today and you should NOT DRIVE or use heavy  machinery until tomorrow (because of the sedation medicines used during the test).    FOLLOW UP: Our staff will call the number listed on your records 48-72 hours following your procedure to check on you and address any questions or concerns that you may have regarding the information given to you following your procedure. If we do not reach you, we will leave a message.  We will attempt to reach you two times.  During this call, we will ask if you have developed any symptoms of COVID 19. If you develop any symptoms (ie: fever, flu-like symptoms, shortness of breath, cough etc.) before then, please call [phone].  If you test positive for Covid 19 in the 2 weeks post procedure, please call and report this information to us.    If any biopsies were taken you will be contacted by phone or by letter within the next 1-3 weeks.  Please call us at [phone] if you have not heard about the biopsies in 3 weeks.    SIGNATURES/CONFIDENTIALITY: You and/or your care partner have signed paperwork which will be entered into your electronic medical record.  These signatures attest to the fact that that the information above on your After Visit Summary has been reviewed and is understood.  Full responsibility of the confidentiality of this discharge information lies with you and/or your care-partner.  

## 2020-04-06 NOTE — Op Note (Signed)
[place] Patient Name: Shirley Carr Procedure Date: 04/06/2020 9:50 AM MRN: [id number] Endoscopist: Remo Lipps P. Havery Moros , MD Age: 30 Referring MD:  Date of Birth: [birth year] Gender: Female Account #: [id number] Procedure:                Colonoscopy Indications:              High risk colon cancer surveillance: Personal                            history of colonic polyps (history of adenoma                            removed 02/2018 at another GI practice),                            incidentally noted chronic intermittent loose                            stools, patient has history of CVID Medicines:                Monitored Anesthesia Care Procedure:                Pre-Anesthesia Assessment:                           - Prior to the procedure, a History and Physical                            was performed, and patient medications and                            allergies were reviewed. The patient's tolerance of                            previous anesthesia was also reviewed. The risks                            and benefits of the procedure and the sedation                            options and risks were discussed with the patient.                            All questions were answered, and informed consent                            was obtained. Prior Anticoagulants: The patient has                            taken no previous anticoagulant or antiplatelet                            agents. ASA Grade Assessment: II - A patient with  mild systemic disease. After reviewing the risks                            and benefits, the patient was deemed in                            satisfactory condition to undergo the procedure.                           After obtaining informed consent, the colonoscope                            was passed under direct vision. Throughout the                            procedure, the patient's blood pressure,  pulse, and                            oxygen saturations were monitored continuously. The                            Olympus PCF-H190DL ([id number]) Colonoscope was                            introduced through the anus and advanced to the the                            terminal ileum, with identification of the                            appendiceal orifice and IC valve. The colonoscopy                            was performed without difficulty. The patient                            tolerated the procedure well. The quality of the                            bowel preparation was good. The terminal ileum,                            ileocecal valve, appendiceal orifice, and rectum                            were photographed. Scope In: 9:57:58 AM Scope Out: 10:17:01 AM Scope Withdrawal Time: 0 hours 16 minutes 31 seconds  Total Procedure Duration: 0 hours 19 minutes 3 seconds  Findings:                 The perianal and digital rectal examinations were                            normal.  The terminal ileum appeared normal.                           A 3 to 4 mm polyp was found in the transverse                            colon. The polyp was sessile. The polyp was removed                            with a cold snare. Resection and retrieval were                            complete.                           The exam was otherwise without abnormality.                           Biopsies for histology were taken with a cold                            forceps from the right colon, left colon and                            transverse colon for evaluation of microscopic                            colitis. Complications:            No immediate complications. Estimated blood loss:                            Minimal. Estimated Blood Loss:     Estimated blood loss was minimal. Impression:               - The examined portion of the ileum was normal.                            - One 3 to 4 mm polyp in the transverse colon,                            removed with a cold snare. Resected and retrieved.                           - The examination was otherwise normal.                           - Biopsies were taken with a cold forceps from the                            right colon, left colon and transverse colon for                            evaluation of microscopic colitis. Recommendation:           -  Patient has a contact number available for                            emergencies. The signs and symptoms of potential                            delayed complications were discussed with the                            patient. Return to normal activities tomorrow.                            Written discharge instructions were provided to the                            patient.                           - Resume previous diet.                           - Continue present medications.                           - Await pathology results. Remo Lipps P. Codi Kertz, MD 04/06/2020 10:20:37 AM This report has been signed electronically.

## 2020-04-06 NOTE — Progress Notes (Signed)
Called to room to assist during endoscopic procedure.  Patient ID and intended procedure confirmed with present staff. Received instructions for my participation in the procedure from the performing physician.  

## 2020-04-06 NOTE — Progress Notes (Signed)
Pt's states no medical or surgical changes since previsit or office visit.  CHECK-IN-JB  VITAL SIGNS-CW

## 2020-04-08 ENCOUNTER — Telehealth: Payer: Self-pay

## 2020-04-08 ENCOUNTER — Telehealth: Payer: Self-pay | Admitting: *Deleted

## 2020-04-08 NOTE — Telephone Encounter (Signed)
Attempted f/u call. Unable to leave VM busy signal.

## 2020-04-08 NOTE — Telephone Encounter (Signed)
  Follow up Call-  Call back number 04/06/2020  Post procedure Call Back phone  # [phone]  Permission to leave phone message Yes     Patient questions:  Do you have a fever, pain , or abdominal swelling? No. Pain Score  0 *  Have you tolerated food without any problems? Yes.    Have you been able to return to your normal activities? Yes.    Do you have any questions about your discharge instructions: Diet   No. Medications  No. Follow up visit  No.  Do you have questions or concerns about your Care? Yes.    Actions: * If pain score is 4 or above: No action needed, pain <4.  1. Have you developed a fever since your procedure? no  2.   Have you had an respiratory symptoms (SOB or cough) since your procedure? no  3.   Have you tested positive for COVID 19 since your procedure no  4.   Have you had any family members/close contacts diagnosed with the COVID 19 since your procedure?  no   If yes to any of these questions please route to Joylene John, RN and Joella Prince, RN

## 2020-04-19 DIAGNOSIS — D839 Common variable immunodeficiency, unspecified: Secondary | ICD-10-CM | POA: Diagnosis not present

## 2020-05-07 ENCOUNTER — Encounter: Payer: Self-pay | Admitting: Allergy

## 2020-05-10 DIAGNOSIS — D839 Common variable immunodeficiency, unspecified: Secondary | ICD-10-CM | POA: Diagnosis not present

## 2020-05-11 ENCOUNTER — Other Ambulatory Visit: Payer: Self-pay

## 2020-05-11 ENCOUNTER — Ambulatory Visit (INDEPENDENT_AMBULATORY_CARE_PROVIDER_SITE_OTHER): Payer: BC Managed Care – PPO | Admitting: Allergy

## 2020-05-11 ENCOUNTER — Encounter: Payer: Self-pay | Admitting: Allergy

## 2020-05-11 VITALS — BP 126/76 | HR 84 | Temp 98.3°F | Resp 20 | Ht 67.72 in | Wt 306.4 lb

## 2020-05-11 DIAGNOSIS — D839 Common variable immunodeficiency, unspecified: Secondary | ICD-10-CM

## 2020-05-11 DIAGNOSIS — J310 Chronic rhinitis: Secondary | ICD-10-CM | POA: Diagnosis not present

## 2020-05-11 DIAGNOSIS — Z8709 Personal history of other diseases of the respiratory system: Secondary | ICD-10-CM

## 2020-05-11 NOTE — Assessment & Plan Note (Signed)
Past history - PND symptoms in the mornings. Patient had skin prick testing and bloodwork in 2020 which was negative to environmental allergens. Interestingly though she had positive testing beforehand and was on immunotherapy for 2 short months.  Interim history - stable.   May use over the counter antihistamines such as Zyrtec (cetirizine), Claritin (loratadine), Allegra (fexofenadine), or Xyzal (levocetirizine) daily as needed.  May use azelastine nasal spray 1-2 sprays per nostril twice a day as needed for runny nose/drainage.

## 2020-05-11 NOTE — Patient Instructions (Addendum)
CVID  Continue with Hyquvia infusions at home.   If you notice that the lump on the abdominal area does not improve then will get ultrasound next.  Send me a message in 1 month.  Avoid that area now for infusions.   Get bloodwork every 4 months - due at the end of July.   Get the day before you are due for your infusion.   Keep track of infections.  Will need CT chest as a baseline. Will start approval process.   Rhinitis:  May use over the counter antihistamines such as Zyrtec (cetirizine), Claritin (loratadine), Allegra (fexofenadine), or Xyzal (levocetirizine) daily as needed.  May use azelastine nasal spray 1-2 sprays per nostril twice a day as needed for runny nose/drainage.  Follow up in 6 months or sooner if needed.

## 2020-05-11 NOTE — Addendum Note (Signed)
Addended by: Felipa Emory on: 05/11/2020 02:44 PM   Modules accepted: Orders

## 2020-05-11 NOTE — Progress Notes (Signed)
Follow Up Note  RE: Shirley Carr MRN: [id number] DOB: [birth year] Date of Office Visit: 05/11/2020  Referring provider: Ma Hillock, DO Primary care provider: Ma Hillock, DO  Chief Complaint: Other (Patient gets allergy injections at home. She has a lump at the injection site. Last injection was Sunday.)  History of Present Illness: I had the pleasure of seeing Shirley Carr for a follow up visit at the [facility] on 05/11/2020. She is a 30 y.o. female, who is being followed for CVID on immunoglobulin replacement (Hyquvia), history of asthma and non-allergic rhinitis. Her previous allergy office visit was on 12/30/2019 with Dr. Maudie Mercury. Today is a regular follow up visit.  CVID (common variable immunodeficiency) Patient is now on Hyqvia every 3 weeks for about 4 months and likes the convenience of the administration with the less frequency.  Patient is self administering at home with no issues - 2 sites over 4.5 hours, total amount about 597mL.   2 infusions ago patient noted a hard lump on her abdominal area on right side about a few days after the infusion. The lump has not changed. The last infusion was on last week on the left side with no issues. She is not sure if she may have infused the right side back to back.   No other associated symptoms. No infections since the last visit and no antibiotics.  Unable to find CT chest results and not sure if she even had one for her chest.   Denies any fevers, chills, lymphadenopathy, joint pains, easy bruising.   Patient got Moderna vaccine x 2 with no issues. On plaquenil for lupus.  Had colonoscopy with benign polyps.   History of asthma Denies any SOB, coughing, wheezing, chest tightness, nocturnal awakenings, ER/urgent care visits or prednisone use since the last visit.  Chronic rhinitis Takes allegra daily and azelastine prn.   Assessment and Plan: Liani is a 30 y.o. female with: CVID (common  variable immunodeficiency) ([place]) Past history - Diagnosed with CVID in 2020 and has been on Hizentra 13gm weekly for the past 6 months with good benefit. Initially had some fatigue and headaches. Last injection on 06/22/2019. Pretreatment levels on 07/09/2018 IgG 459, IgA <40, IgM 28. History of frequent sinusitis, 1 pneumonia, ear infections, meningitis, foot infection requiring hospitalization. 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 History of ITP in [address], diagnosed with lupus and on Plaquenil. No history of blood clots.  Interim history - no infections/antibiotics since last visit. 03/26/20 IgG 1,175. Now on Hyquvia 55gm every 3 weeks at home.   Continue with Hyquvia infusions at home.   If you notice that the lump on the abdominal area does not improve then will get ultrasound next.  Avoid that area now for infusions.   Get bloodwork every 4 months - due at the end of July.   Get the day before you are due for your infusion.   Keep track of infections.  Will need a baseline CT chest.    Get Moderna booster when eligible.   Get flu vaccine in the fall.   Nonallergic rhinitis Past history - PND symptoms in the mornings. Patient had skin prick testing and bloodwork in 2020 which was negative to environmental allergens. Interestingly though she had positive testing beforehand and was on immunotherapy for 2 short months.  Interim history - stable.   May use over the counter antihistamines such as Zyrtec (cetirizine), Claritin (loratadine), Allegra (fexofenadine), or Xyzal (levocetirizine) daily  as needed.  May use azelastine nasal spray 1-2 sprays per nostril twice a day as needed for runny nose/drainage.  Return in about 6 months (around 11/11/2020).  No orders of the defined types were placed in this encounter.   Lab Orders     CBC with Differential/Platelet     Comprehensive metabolic panel     IgG, IgA, IgM     Sedimentation rate     C-reactive  protein  Diagnostics: Spirometry:  Tracings reviewed. Her effort: Good reproducible efforts. FVC: 3.96L FEV1: 3.20L, 87% predicted FEV1/FVC ratio: 81% Interpretation: Spirometry consistent with normal pattern.  Please see scanned spirometry results for details.  Medication List:  Current Outpatient Medications  Medication Sig Dispense Refill  . azelastine (ASTELIN) 0.1 % nasal spray Place 1-2 sprays into both nostrils 2 (two) times daily as needed for rhinitis. Use in each nostril as directed 30 mL 5  . Cholecalciferol (VITAMIN D) 50 MCG (2000 UT) tablet Take 2,000 Units by mouth daily.    . fexofenadine (ALLEGRA) 180 MG tablet Take 180 mg by mouth daily.    . hydroxychloroquine (PLAQUENIL) 200 MG tablet Take 400 mg by mouth daily.    . Immune Globulin-Hyaluronidase (HYQVIA [place]) Inject 55 g into the skin.    Marland Kitchen norethindrone (MICRONOR) 0.35 MG tablet Take 1 tablet by mouth daily.    Marland Kitchen omeprazole (PRILOSEC) 20 MG capsule Take 20 mg by mouth as needed.     No current facility-administered medications for this visit.   Allergies: Allergies  Allergen Reactions  . Pseudoephedrine Other (See Comments)    Joint swelling   I reviewed her past medical history, social history, family history, and environmental history and no significant changes have been reported from her previous visit.  Review of Systems  Constitutional: Negative for appetite change, chills, fever and unexpected weight change.  HENT: Negative for congestion, postnasal drip and rhinorrhea.   Eyes: Negative for itching.  Respiratory: Negative for cough, chest tightness, shortness of breath and wheezing.   Cardiovascular: Negative for chest pain.  Gastrointestinal: Negative for abdominal pain.  Genitourinary: Negative for difficulty urinating.  Skin: Negative for rash.  Allergic/Immunologic: Negative for environmental allergies and food allergies.  Neurological: Negative for headaches.   Objective: BP 126/76   Pulse  84   Temp 98.3 F (36.8 C) (Tympanic)   Resp 20   Ht 5' 7.72" (1.72 m)   Wt (!) 306 lb 6.4 oz (139 kg)   SpO2 97%   BMI 46.98 kg/m  Body mass index is 46.98 kg/m. Physical Exam Vitals and nursing note reviewed.  Constitutional:      Appearance: Normal appearance. She is well-developed.  HENT:     Head: Normocephalic and atraumatic.     Right Ear: Tympanic membrane and external ear normal.     Left Ear: Tympanic membrane and external ear normal.     Nose: Nose normal.     Mouth/Throat:     Mouth: Mucous membranes are moist.     Pharynx: Oropharynx is clear.  Eyes:     Conjunctiva/sclera: Conjunctivae normal.  Cardiovascular:     Rate and Rhythm: Normal rate and regular rhythm.     Heart sounds: Normal heart sounds. No murmur heard. No friction rub. No gallop.   Pulmonary:     Effort: Pulmonary effort is normal.     Breath sounds: Normal breath sounds. No wheezing, rhonchi or rales.  Abdominal:     Palpations: Abdomen is soft.  Comments: Right abdominal area - small circular lump palpated. No pain with palpation.   Musculoskeletal:     Cervical back: Neck supple.  Skin:    General: Skin is warm.     Findings: No rash.  Neurological:     Mental Status: She is alert and oriented to person, place, and time.  Psychiatric:        Mood and Affect: Mood normal.        Behavior: Behavior normal.    Previous notes and tests were reviewed. The plan was reviewed with the patient/family, and all questions/concerned were addressed.  It was my pleasure to see Makailee today and participate in her care. Please feel free to contact me with any questions or concerns.  Sincerely,  Rexene Alberts, DO Allergy & Immunology  [facility] office: [place] office: [phone]

## 2020-05-11 NOTE — Assessment & Plan Note (Signed)
Past history - Diagnosed with CVID in 2020 and has been on Hizentra 13gm weekly for the past 6 months with good benefit. Initially had some fatigue and headaches. Last injection on 06/22/2019. Pretreatment levels on 07/09/2018 IgG 459, IgA <40, IgM 28. History of frequent sinusitis, 1 pneumonia, ear infections, meningitis, foot infection requiring hospitalization. 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 History of ITP in [address], diagnosed with lupus and on Plaquenil. No history of blood clots.  Interim history - no infections/antibiotics since last visit. 03/26/20 IgG 1,175. Now on Hyquvia 55gm every 3 weeks at home.   Continue with Hyquvia infusions at home.   If you notice that the lump on the abdominal area does not improve then will get ultrasound next.  Avoid that area now for infusions.   Get bloodwork every 4 months - due at the end of July.   Get the day before you are due for your infusion.   Keep track of infections.  Will need a baseline CT chest.    Get Moderna booster when eligible.   Get flu vaccine in the fall.

## 2020-05-30 DIAGNOSIS — D839 Common variable immunodeficiency, unspecified: Secondary | ICD-10-CM | POA: Diagnosis not present

## 2020-06-07 DIAGNOSIS — H1045 Other chronic allergic conjunctivitis: Secondary | ICD-10-CM | POA: Diagnosis not present

## 2020-06-07 DIAGNOSIS — H04123 Dry eye syndrome of bilateral lacrimal glands: Secondary | ICD-10-CM | POA: Diagnosis not present

## 2020-06-07 DIAGNOSIS — H5213 Myopia, bilateral: Secondary | ICD-10-CM | POA: Diagnosis not present

## 2020-06-08 DIAGNOSIS — M329 Systemic lupus erythematosus, unspecified: Secondary | ICD-10-CM | POA: Diagnosis not present

## 2020-06-08 DIAGNOSIS — Z79899 Other long term (current) drug therapy: Secondary | ICD-10-CM | POA: Diagnosis not present

## 2020-06-08 DIAGNOSIS — I7300 Raynaud's syndrome without gangrene: Secondary | ICD-10-CM | POA: Diagnosis not present

## 2020-06-20 DIAGNOSIS — D839 Common variable immunodeficiency, unspecified: Secondary | ICD-10-CM | POA: Diagnosis not present

## 2020-06-29 ENCOUNTER — Ambulatory Visit: Payer: BC Managed Care – PPO | Admitting: Allergy

## 2020-07-08 DIAGNOSIS — D839 Common variable immunodeficiency, unspecified: Secondary | ICD-10-CM | POA: Diagnosis not present

## 2020-07-09 LAB — CBC WITH DIFFERENTIAL/PLATELET
Basophils Absolute: 0 10*3/uL (ref 0.0–0.2)
Basos: 1 %
EOS (ABSOLUTE): 0.1 10*3/uL (ref 0.0–0.4)
Eos: 1 %
Hematocrit: 45 % (ref 34.0–46.6)
Hemoglobin: 14.6 g/dL (ref 11.1–15.9)
Immature Grans (Abs): 0 10*3/uL (ref 0.0–0.1)
Immature Granulocytes: 0 %
Lymphocytes Absolute: 1.7 10*3/uL (ref 0.7–3.1)
Lymphs: 25 %
MCH: 27 pg (ref 26.6–33.0)
MCHC: 32.4 g/dL (ref 31.5–35.7)
MCV: 83 fL (ref 79–97)
Monocytes Absolute: 0.5 10*3/uL (ref 0.1–0.9)
Monocytes: 8 %
Neutrophils Absolute: 4.3 10*3/uL (ref 1.4–7.0)
Neutrophils: 65 %
Platelets: 266 10*3/uL (ref 150–450)
RBC: 5.4 x10E6/uL — ABNORMAL HIGH (ref 3.77–5.28)
RDW: 13.8 % (ref 11.7–15.4)
WBC: 6.6 10*3/uL (ref 3.4–10.8)

## 2020-07-09 LAB — COMPREHENSIVE METABOLIC PANEL
ALT: 13 IU/L (ref 0–32)
AST: 16 IU/L (ref 0–40)
Albumin/Globulin Ratio: 1.8 (ref 1.2–2.2)
Albumin: 4.4 g/dL (ref 3.9–5.0)
Alkaline Phosphatase: 87 IU/L (ref 44–121)
BUN/Creatinine Ratio: 10 (ref 9–23)
BUN: 9 mg/dL (ref 6–20)
Bilirubin Total: 0.2 mg/dL (ref 0.0–1.2)
CO2: 22 mmol/L (ref 20–29)
Calcium: 9.4 mg/dL (ref 8.7–10.2)
Chloride: 103 mmol/L (ref 96–106)
Creatinine, Ser: 0.91 mg/dL (ref 0.57–1.00)
Globulin, Total: 2.5 g/dL (ref 1.5–4.5)
Glucose: 84 mg/dL (ref 65–99)
Potassium: 4.4 mmol/L (ref 3.5–5.2)
Sodium: 139 mmol/L (ref 134–144)
Total Protein: 6.9 g/dL (ref 6.0–8.5)
eGFR: 88 mL/min/{1.73_m2} (ref >59)

## 2020-07-09 LAB — IGG, IGA, IGM
IgA/Immunoglobulin A, Serum: 29 mg/dL — ABNORMAL LOW (ref 87–352)
IgG (Immunoglobin G), Serum: 1065 mg/dL (ref 586–1602)
IgM (Immunoglobulin M), Srm: 22 mg/dL — ABNORMAL LOW (ref 26–217)

## 2020-07-09 LAB — SEDIMENTATION RATE: Sed Rate: 17 mm/hr (ref 0–32)

## 2020-07-09 LAB — C-REACTIVE PROTEIN: CRP: 14 mg/L — ABNORMAL HIGH (ref 0–10)

## 2020-07-11 DIAGNOSIS — D839 Common variable immunodeficiency, unspecified: Secondary | ICD-10-CM | POA: Diagnosis not present

## 2020-07-28 ENCOUNTER — Encounter: Payer: Self-pay | Admitting: Family Medicine

## 2020-07-29 ENCOUNTER — Encounter: Payer: Self-pay | Admitting: Allergy

## 2020-07-29 ENCOUNTER — Encounter: Payer: Self-pay | Admitting: Family Medicine

## 2020-07-29 ENCOUNTER — Telehealth (INDEPENDENT_AMBULATORY_CARE_PROVIDER_SITE_OTHER): Payer: BC Managed Care – PPO | Admitting: Family Medicine

## 2020-07-29 DIAGNOSIS — U071 COVID-19: Secondary | ICD-10-CM | POA: Diagnosis not present

## 2020-07-29 DIAGNOSIS — D839 Common variable immunodeficiency, unspecified: Secondary | ICD-10-CM

## 2020-07-29 HISTORY — DX: COVID-19: U07.1

## 2020-07-29 MED ORDER — MOLNUPIRAVIR EUA 200MG CAPSULE: 4.0000 | ORAL_CAPSULE | Freq: Two times a day (BID) | ORAL | 0 refills | Status: AC

## 2020-07-29 NOTE — Patient Instructions (Signed)
10 Things You Can Do to Manage Your COVID-19 Symptoms at Home If you have possible or confirmed COVID-19 Stay home except to get medical care. Monitor your symptoms carefully. If your symptoms get worse, call your healthcare provider immediately. Get rest and stay hydrated. If you have a medical appointment, call the healthcare provider ahead of time and tell them that you have or may have COVID-19. For medical emergencies, call 911 and notify the dispatch personnel that you have or may have COVID-19. Cover your cough and sneezes with a tissue or use the inside of your elbow. Wash your hands often with soap and water for at least 20 seconds or clean your hands with an alcohol-based hand sanitizer that contains at least 60% alcohol. As much as possible, stay in a specific room and away from other people in your home. Also, you should use a separate bathroom, if available. If you need to be around other people in or outside of the home, wear a mask. Avoid sharing personal items with other people in your household, like dishes, towels, and bedding. Clean all surfaces that are touched often, like counters, tabletops, and doorknobs. Use household cleaning sprays or wipes according to the label instructions. cdc.gov/coronavirus 07/18/2019 This information is not intended to replace advice given to you by your health care provider. Make sure you discuss any questions you have with your healthcare provider. Document Revised: 02/06/2020 Document Reviewed: 02/06/2020 Elsevier Patient Education  2022 [employer]  

## 2020-07-29 NOTE — Progress Notes (Signed)
VIRTUAL VISIT VIA VIDEO  I connected with Shirley Carr on 07/29/20 at 10:30 AM EDT by elemedicine application and verified that I am speaking with the correct person using two identifiers. Location patient: Home Location provider: [facility], Office Persons participating in the virtual visit: Patient, Dr. Raoul Pitch and Darnell Level. Cesar, CMA  I discussed the limitations of evaluation and management by telemedicine and the availability of in person appointments. The patient expressed understanding and agreed to proceed.   SUBJECTIVE Chief Complaint  Patient presents with   Covid Positive    Pt c/o nasal congestion, chills, sweats, brain fog, fatigue x 1 day; pt home tested pos today;     HPI: Shirley Carr is a 30 y.o. female present for covid illness.  Exposure:significant other  Sx start: yesterday  Vaccine:moderna x2 Pt endorses headache, nasal congestion, increased phlegm prdx, chills, sweats, brain fog and fatigue.  Pt denies shortness of breath, NVD or fever.  Otc: dayquil.  GFR: > 100 Anticoag: n/a Pt has a sig h/o lupus, asthma (childhood) and CVID. She is taking plaquenil and Hyqvia [place] inj for those conditions and her next injection is due this Monday.    ROS: See pertinent positives and negatives per HPI.  Patient Active Problem List   Diagnosis Date Noted   COVID-19 07/29/2020   Hemoglobinuria 02/20/2020   Arthritis 02/17/2020   Immune thrombocytopenic purpura ([place]) 02/17/2020   Irritable bowel syndrome 02/17/2020   Lupus ([place]) 02/17/2020   Morbid obesity ([place]) 02/17/2020   Raynaud phenomenon 02/17/2020   CVID (common variable immunodeficiency) ([place]) 06/26/2019   Nonallergic rhinitis 06/26/2019   History of asthma 06/26/2019    Social History   Tobacco Use   Smoking status: Never   Smokeless tobacco: Never  Substance Use Topics   Alcohol use: Yes    Comment: occasionaly    Current Outpatient Medications:    Cholecalciferol (VITAMIN D) 50 MCG  (2000 UT) tablet, Take 2,000 Units by mouth daily., Disp: , Rfl:    hydroxychloroquine (PLAQUENIL) 200 MG tablet, Take 400 mg by mouth daily., Disp: , Rfl:    Immune Globulin-Hyaluronidase (HYQVIA [place]), Inject 55 g into the skin., Disp: , Rfl:    molnupiravir EUA 200 mg CAPS, Take 4 capsules (800 mg total) by mouth 2 (two) times daily for 5 days., Disp: 40 capsule, Rfl: 0   norethindrone (MICRONOR) 0.35 MG tablet, Take 1 tablet by mouth daily., Disp: , Rfl:    omeprazole (PRILOSEC) 20 MG capsule, Take 20 mg by mouth as needed., Disp: , Rfl:   Allergies  Allergen Reactions   Pseudoephedrine Other (See Comments)    Joint swelling    OBJECTIVE: There were no vitals taken for this visit. Gen: No acute distress. Nontoxic in appearance.  HENT: AT. [place].  MMM. Sounds congested.  Eyes:Pupils Equal Round Reactive to light, Extraocular movements intact,  Conjunctiva without redness, discharge or icterus. Chest: Cough not present- no shortness of breath Skin: no rashes, purpura or petechiae.  Neuro: Alert. Oriented x3  Psych: Normal affect and demeanor. Normal speech. Normal thought content and judgment.  ASSESSMENT AND PLAN: Shirley Carr is a 30 y.o. female present for  COVID-19/CVID (common variable immunodeficiency) ([place]) Rest, hydrate.  Encouraged OTC mucinex (DM if cough), nettie pot or nasal saline.  molnupiravir prescribed, take until completed.  Encouraged her to call or allergy doctor for recs on next HyQVIA [place] injection since she is ill and will still be antiviral Monday when next inj due.  Reviewed  home care instructions for COVID. Advised self-isolation at home for at least 5 days. After 5 days, if improved and fever resolved, can be in public, but should wear a mask around others for an additional 5 days. If symptoms, esp, dyspnea develops/worsens, recommend in-person evaluation at either an urgent care or the emergency room.   Howard Pouch, DO 07/29/2020   Return in about 2  weeks (around 08/12/2020), or if symptoms worsen or fail to improve.  No orders of the defined types were placed in this encounter.  Meds ordered this encounter  Medications   molnupiravir EUA 200 mg CAPS    Sig: Take 4 capsules (800 mg total) by mouth 2 (two) times daily for 5 days.    Dispense:  40 capsule    Refill:  0   Referral Orders  No referral(s) requested today

## 2020-08-01 DIAGNOSIS — D839 Common variable immunodeficiency, unspecified: Secondary | ICD-10-CM | POA: Diagnosis not present

## 2020-08-22 DIAGNOSIS — D839 Common variable immunodeficiency, unspecified: Secondary | ICD-10-CM | POA: Diagnosis not present

## 2020-09-10 ENCOUNTER — Encounter: Payer: Self-pay | Admitting: Allergy

## 2020-09-10 DIAGNOSIS — D839 Common variable immunodeficiency, unspecified: Secondary | ICD-10-CM

## 2020-09-12 DIAGNOSIS — D839 Common variable immunodeficiency, unspecified: Secondary | ICD-10-CM | POA: Diagnosis not present

## 2020-09-13 NOTE — Telephone Encounter (Signed)
I like to get bloodwork on infusions patients every 3-4 months.  Orders in - please print and mail to patient.  Thank you.

## 2020-09-20 DIAGNOSIS — M069 Rheumatoid arthritis, unspecified: Secondary | ICD-10-CM | POA: Diagnosis not present

## 2020-09-20 DIAGNOSIS — H04123 Dry eye syndrome of bilateral lacrimal glands: Secondary | ICD-10-CM | POA: Diagnosis not present

## 2020-09-20 DIAGNOSIS — Z79899 Other long term (current) drug therapy: Secondary | ICD-10-CM | POA: Diagnosis not present

## 2020-09-20 DIAGNOSIS — H1045 Other chronic allergic conjunctivitis: Secondary | ICD-10-CM | POA: Diagnosis not present

## 2020-10-01 DIAGNOSIS — D839 Common variable immunodeficiency, unspecified: Secondary | ICD-10-CM | POA: Diagnosis not present

## 2020-10-02 LAB — CBC WITH DIFFERENTIAL/PLATELET
Basophils Absolute: 0 10*3/uL (ref 0.0–0.2)
Basos: 1 %
EOS (ABSOLUTE): 0.1 10*3/uL (ref 0.0–0.4)
Eos: 1 %
Hematocrit: 45 % (ref 34.0–46.6)
Hemoglobin: 14.7 g/dL (ref 11.1–15.9)
Immature Grans (Abs): 0 10*3/uL (ref 0.0–0.1)
Immature Granulocytes: 0 %
Lymphocytes Absolute: 2 10*3/uL (ref 0.7–3.1)
Lymphs: 31 %
MCH: 27 pg (ref 26.6–33.0)
MCHC: 32.7 g/dL (ref 31.5–35.7)
MCV: 83 fL (ref 79–97)
Monocytes Absolute: 0.6 10*3/uL (ref 0.1–0.9)
Monocytes: 9 %
Neutrophils Absolute: 3.8 10*3/uL (ref 1.4–7.0)
Neutrophils: 58 %
Platelets: 290 10*3/uL (ref 150–450)
RBC: 5.44 x10E6/uL — ABNORMAL HIGH (ref 3.77–5.28)
RDW: 13.4 % (ref 11.7–15.4)
WBC: 6.5 10*3/uL (ref 3.4–10.8)

## 2020-10-02 LAB — IGG, IGA, IGM
IgA/Immunoglobulin A, Serum: 24 mg/dL — ABNORMAL LOW (ref 87–352)
IgG (Immunoglobin G), Serum: 1227 mg/dL (ref 586–1602)
IgM (Immunoglobulin M), Srm: 23 mg/dL — ABNORMAL LOW (ref 26–217)

## 2020-10-02 LAB — COMPREHENSIVE METABOLIC PANEL
ALT: 15 IU/L (ref 0–32)
AST: 23 IU/L (ref 0–40)
Albumin/Globulin Ratio: 1.7 (ref 1.2–2.2)
Albumin: 4.7 g/dL (ref 3.9–5.0)
Alkaline Phosphatase: 91 IU/L (ref 44–121)
BUN/Creatinine Ratio: 9 (ref 9–23)
BUN: 8 mg/dL (ref 6–20)
Bilirubin Total: 0.3 mg/dL (ref 0.0–1.2)
CO2: 21 mmol/L (ref 20–29)
Calcium: 9.4 mg/dL (ref 8.7–10.2)
Chloride: 103 mmol/L (ref 96–106)
Creatinine, Ser: 0.93 mg/dL (ref 0.57–1.00)
Globulin, Total: 2.7 g/dL (ref 1.5–4.5)
Glucose: 81 mg/dL (ref 70–99)
Potassium: 4.2 mmol/L (ref 3.5–5.2)
Sodium: 143 mmol/L (ref 134–144)
Total Protein: 7.4 g/dL (ref 6.0–8.5)
eGFR: 86 mL/min/{1.73_m2} (ref >59)

## 2020-10-02 LAB — C-REACTIVE PROTEIN: CRP: 12 mg/L — ABNORMAL HIGH (ref 0–10)

## 2020-10-02 LAB — SEDIMENTATION RATE: Sed Rate: 15 mm/hr (ref 0–32)

## 2020-10-03 DIAGNOSIS — D839 Common variable immunodeficiency, unspecified: Secondary | ICD-10-CM | POA: Diagnosis not present

## 2020-10-08 DIAGNOSIS — I7300 Raynaud's syndrome without gangrene: Secondary | ICD-10-CM | POA: Diagnosis not present

## 2020-10-08 DIAGNOSIS — M329 Systemic lupus erythematosus, unspecified: Secondary | ICD-10-CM | POA: Diagnosis not present

## 2020-10-08 DIAGNOSIS — Z79899 Other long term (current) drug therapy: Secondary | ICD-10-CM | POA: Diagnosis not present

## 2020-10-24 DIAGNOSIS — D839 Common variable immunodeficiency, unspecified: Secondary | ICD-10-CM | POA: Diagnosis not present

## 2020-11-04 ENCOUNTER — Ambulatory Visit (INDEPENDENT_AMBULATORY_CARE_PROVIDER_SITE_OTHER): Payer: BC Managed Care – PPO | Admitting: Family Medicine

## 2020-11-04 ENCOUNTER — Encounter: Payer: Self-pay | Admitting: Family Medicine

## 2020-11-04 ENCOUNTER — Other Ambulatory Visit: Payer: Self-pay

## 2020-11-04 VITALS — BP 112/78 | HR 84 | Temp 98.2°F | Ht 68.0 in | Wt 307.0 lb

## 2020-11-04 DIAGNOSIS — F410 Panic disorder [episodic paroxysmal anxiety] without agoraphobia: Secondary | ICD-10-CM

## 2020-11-04 DIAGNOSIS — F419 Anxiety disorder, unspecified: Secondary | ICD-10-CM | POA: Diagnosis not present

## 2020-11-04 MED ORDER — DULOXETINE HCL 30 MG PO CPEP: 30.0000 mg | ORAL_CAPSULE | Freq: Every day | ORAL | 1 refills | Status: DC

## 2020-11-04 MED ORDER — HYDROXYZINE HCL 10 MG PO TABS: 10.0000 mg | ORAL_TABLET | Freq: Three times a day (TID) | ORAL | 0 refills | Status: DC | PRN
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
## 2020-11-04 NOTE — Patient Instructions (Signed)
Great to see you today.  I have refilled the medication(s) we provide.   If labs were collected, we will inform you of lab results once received either by echart message or telephone call.   - echart message- for normal results that have been seen by the patient already.   - telephone call: abnormal results or if patient has not viewed results in their echart.  Use the vistaril as needed for panic or anticipating increased anxiety. Try at home first to make sure it does not make you groggy. I fit dies then take 1/2 tab instead.   Start Cymbalta- every morning.  Follow up 4 weeks for recheck .   Managing Anxiety, Adult After being diagnosed with an anxiety disorder, you may be relieved to know why you have felt or behaved a certain way. You may also feel overwhelmed about the treatment ahead and what it will mean for your life. With care and support, you can manage this condition and recover from it. How to manage lifestyle changes Managing stress and anxiety Stress is your body's reaction to life changes and events, both good and bad. Most stress will last just a few hours, but stress can be ongoing and can lead to more than just stress. Although stress can play a major role in anxiety, it is not the same as anxiety. Stress is usually caused by something external, such as a deadline, test, or competition. Stress normally passes after the triggering event has ended.  Anxiety is caused by something internal, such as imagining a terrible outcome or worrying that something will go wrong that will devastate you. Anxiety often does not go away even after the triggering event is over, and it can become long-term (chronic) worry. It is important to understand the differences between stress and anxiety and to manage your stress effectively so that it does not lead to an anxious response. Talk with your health care provider or a counselor to learn more about reducing anxiety and stress. He or she may  suggest tension reduction techniques, such as: Music therapy. This can include creating or listening to music that you enjoy and that inspires you. Mindfulness-based meditation. This involves being aware of your normal breaths while not trying to control your breathing. It can be done while sitting or walking. Centering prayer. This involves focusing on a word, phrase, or sacred image that means something to you and brings you peace. Deep breathing. To do this, expand your stomach and inhale slowly through your nose. Hold your breath for 3-5 seconds. Then exhale slowly, letting your stomach muscles relax. Self-talk. This involves identifying thought patterns that lead to anxiety reactions and changing those patterns. Muscle relaxation. This involves tensing muscles and then relaxing them. Choose a tension reduction technique that suits your lifestyle and personality. These techniques take time and practice. Set aside 5-15 minutes a day to do them. Therapists can offer counseling and training in these techniques. The training to help with anxiety may be covered by some insurance plans. Other things you can do to manage stress and anxiety include: Keeping a stress/anxiety diary. This can help you learn what triggers your reaction and then learn ways to manage your response. Thinking about how you react to certain situations. You may not be able to control everything, but you can control your response. Making time for activities that help you relax and not feeling guilty about spending your time in this way. Visual imagery and yoga can help you stay calm  and relax.  Medicines Medicines can help ease symptoms. Medicines for anxiety include: Anti-anxiety drugs. Antidepressants. Medicines are often used as a primary treatment for anxiety disorder. Medicines will be prescribed by a health care provider. When used together, medicines, psychotherapy, and tension reduction techniques may be the most effective  treatment. Relationships Relationships can play a big part in helping you recover. Try to spend more time connecting with trusted friends and family members. Consider going to couples counseling, taking family education classes, or going to family therapy. Therapy can help you and others better understand your condition. How to recognize changes in your anxiety Everyone responds differently to treatment for anxiety. Recovery from anxiety happens when symptoms decrease and stop interfering with your daily activities at home or work. This may mean that you will start to: Have better concentration and focus. Worry will interfere less in your daily thinking. Sleep better. Be less irritable. Have more energy. Have improved memory. It is important to recognize when your condition is getting worse. Contact your health care provider if your symptoms interfere with home or work and you feel like your condition is not improving. Follow these instructions at home: Activity Exercise. Most adults should do the following: Exercise for at least 150 minutes each week. The exercise should increase your heart rate and make you sweat (moderate-intensity exercise). Strengthening exercises at least twice a week. Get the right amount and quality of sleep. Most adults need 7-9 hours of sleep each night. Lifestyle  Eat a healthy diet that includes plenty of vegetables, fruits, whole grains, low-fat dairy products, and lean protein. Do not eat a lot of foods that are high in solid fats, added sugars, or salt. Make choices that simplify your life. Do not use any products that contain nicotine or tobacco, such as cigarettes, e-cigarettes, and chewing tobacco. If you need help quitting, ask your health care provider. Avoid caffeine, alcohol, and certain over-the-counter cold medicines. These may make you feel worse. Ask your pharmacist which medicines to avoid. General instructions Take over-the-counter and prescription  medicines only as told by your health care provider. Keep all follow-up visits as told by your health care provider. This is important. Where to find support You can get help and support from these sources: Self-help groups. Online and [employer]. A trusted spiritual leader. Couples counseling. Family education classes. Family therapy. Where to find more information You may find that joining a support group helps you deal with your anxiety. The following sources can help you locate counselors or support groups near you: [place]: www.mentalhealthamerica.net Anxiety and Depression Association of [place] (ADAA): https://www.clark.net/ National Alliance on Mental Illness (NAMI): www.nami.org Contact a health care provider if you: Have a hard time staying focused or finishing daily tasks. Spend many hours a day feeling worried about everyday life. Become exhausted by worry. Start to have headaches, feel tense, or have nausea. Urinate more than normal. Have diarrhea. Get help right away if you have: A racing heart and shortness of breath. Thoughts of hurting yourself or others. If you ever feel like you may hurt yourself or others, or have thoughts about taking your own life, get help right away. You can go to your nearest emergency department or call: Your local emergency services (911 in the [place] A suicide crisis helpline, such as the [place] at 715-314-0419. This is open 24 hours a day. Summary Taking steps to learn and use tension reduction techniques can help calm you and help prevent  triggering an anxiety reaction. When used together, medicines, psychotherapy, and tension reduction techniques may be the most effective treatment. Family, friends, and partners can play a big part in helping you recover from an anxiety disorder. This information is not intended to replace advice given to you by your health care provider. Make sure you  discuss any questions you have with your health care provider. Document Revised: 05/21/2018 Document Reviewed: 05/21/2018 Elsevier Patient Education  [place].

## 2020-11-10 NOTE — Progress Notes (Signed)
Follow Up Note  RE: Shirley Carr MRN: [id number] DOB: 06-25-1990 Date of Office Visit: 11/11/2020  Referring provider: Ma Hillock, DO Primary care provider: Ma Hillock, DO  Chief Complaint: Asthma (ACT - 25 no flares )  History of Present Illness: I had the pleasure of seeing Shirley Carr for a follow up visit at the [facility] on 11/11/2020. She is a 30 y.o. female, who is being followed for CVID on HyQvia and non-allergic rhinitis. Her previous allergy office visit was on 05/11/2020 with Dr. Maudie Mercury. Today is a regular follow up visit.  CVID (common variable immunodeficiency)  Patient had Covid-19 in July. Had symptoms for about 1 week and took Paxlovid.  Currently on Hyquvia 55gm every 3 weeks at home with no issues. Lumps on the abdominal area resolved after she increased the infusion rate.  No other infections since the last visit and no antibiotics use.   Denies any fatigue, fevers, chills, bruising, bleeding, lymphadenopathy. No issues with her breathing. Started to go back to the gym.   Patient declines flu vaccine as in 2016 she felt sick afterwards.   Patient has gotten 2 Moderna vaccines.   Patient did not get CT chest.  Nonallergic rhinitis Asymptomatic.   Patient noticed some anxiety the past few months and started on Cymbalta x 1 week.  Followed by rheumatology for lupus and stable on plaquenil.   Component     Latest Ref Rng & Units 07/01/2019 12/08/2019 03/26/2020 07/08/2020  IgG (Immunoglobin G), Serum     586 - 1,602 mg/dL 1,005 1,102 1,175 1,065  IgA/Immunoglobulin A, Serum     87 - 352 mg/dL 31 (L) 30 (L) 31 (L) 29 (L)  IgM (Immunoglobulin M), Srm     26 - 217 mg/dL 20 (L) 22 (L) 27 22 (L)   Component     Latest Ref Rng & Units 10/01/2020  IgG (Immunoglobin G), Serum     586 - 1,602 mg/dL 1,227  IgA/Immunoglobulin A, Serum     87 - 352 mg/dL 24 (L)  IgM (Immunoglobulin M), Srm     26 - 217 mg/dL 23 (L)     Assessment and Plan: [name] is a 30 y.o. female with: CVID (common variable immunodeficiency) ([place]) Past history - Diagnosed with CVID in 2020 and has been on Hizentra 13gm weekly for the past 6 months with good benefit. Initially had some fatigue and headaches. Last injection on 06/22/2019. Pretreatment levels on 07/09/2018 IgG 459, IgA <40, IgM 28. History of frequent sinusitis, 1 pneumonia, ear infections, meningitis, foot infection requiring hospitalization. 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 History of ITP in 20211, diagnosed with lupus and on Plaquenil. No history of blood clots.  Interim history - had Covid-19 but no antibiotics since last visit. Lumps on the abdominal area resolved with faster infusion rate. Normal spirometry today. Continue with Hyquvia 55gm infusions at home every 3 weeks.  Get bloodwork every 4 months - due in January.  Get the day before you are due for your infusion.  Keep track of infections. Will need CT chest as a baseline. Please call the imaging center to schedule. Per insurance you don't need a prior authorization.  Recommend annual flu vaccine.   Nonallergic rhinitis Past history - PND symptoms in the mornings. Patient had skin prick testing and bloodwork in 2020 which was negative to environmental allergens. Interestingly though she had positive testing beforehand and was on immunotherapy for 2 short months.  Interim history - asymptomatic.  May use over the counter antihistamines such as Zyrtec (cetirizine), Claritin (loratadine), Allegra (fexofenadine), or Xyzal (levocetirizine) daily as needed. May use azelastine nasal spray 1-2 sprays per nostril twice a day as needed for runny nose/drainage.  Return in about 6 months (around 05/11/2021).  No orders of the defined types were placed in this encounter.  Lab Orders         IgG, IgA, IgM         CBC with Differential/Platelet         Comprehensive metabolic panel          Sedimentation rate         C-reactive protein      Diagnostics: Spirometry:  Tracings reviewed. Her effort: Good reproducible efforts. FVC: 3.82L FEV1: 3.19L, 90% predicted FEV1/FVC ratio: 84% Interpretation: Spirometry consistent with normal pattern.  Please see scanned spirometry results for details.  Medication List:  Current Outpatient Medications  Medication Sig Dispense Refill   Cholecalciferol (VITAMIN D) 50 MCG (2000 UT) tablet Take 2,000 Units by mouth daily.     DULoxetine (CYMBALTA) 30 MG capsule Take 1 capsule (30 mg total) by mouth daily. 30 capsule 1   hydroxychloroquine (PLAQUENIL) 200 MG tablet Take 400 mg by mouth daily.     hydrOXYzine (ATARAX/VISTARIL) 10 MG tablet Take 1 tablet (10 mg total) by mouth every 8 (eight) hours as needed. 90 tablet 0   Immune Globulin-Hyaluronidase (HYQVIA [place]) Inject 55 g into the skin.     norethindrone (MICRONOR) 0.35 MG tablet Take 1 tablet by mouth daily.     omeprazole (PRILOSEC) 20 MG capsule Take 20 mg by mouth as needed.     No current facility-administered medications for this visit.   Allergies: Allergies  Allergen Reactions   Pseudoephedrine Other (See Comments)    Joint swelling   I reviewed her past medical history, social history, family history, and environmental history and no significant changes have been reported from her previous visit.  Review of Systems  Constitutional:  Negative for appetite change, chills, fever and unexpected weight change.  HENT:  Negative for congestion, postnasal drip and rhinorrhea.   Eyes:  Negative for itching.  Respiratory:  Negative for cough, chest tightness, shortness of breath and wheezing.   Cardiovascular:  Negative for chest pain.  Gastrointestinal:  Negative for abdominal pain.  Genitourinary:  Negative for difficulty urinating.  Skin:  Negative for rash.  Allergic/Immunologic: Negative for environmental allergies and food allergies.  Neurological:  Negative for  headaches.   Objective: BP 124/84   Pulse 77   Temp 98.5 F (36.9 C)   Resp 16   Ht 5\' 7"  (1.702 m)   Wt (!) 303 lb 12.8 oz (137.8 kg)   LMP 10/20/2020   SpO2 97%   BMI 47.58 kg/m  Body mass index is 47.58 kg/m. Physical Exam Vitals and nursing note reviewed.  Constitutional:      Appearance: Normal appearance. She is well-developed.  HENT:     Head: Normocephalic and atraumatic.     Right Ear: Tympanic membrane and external ear normal.     Left Ear: Tympanic membrane and external ear normal.     Nose: Nose normal.     Mouth/Throat:     Mouth: Mucous membranes are moist.     Pharynx: Oropharynx is clear.  Eyes:     Conjunctiva/sclera: Conjunctivae normal.  Cardiovascular:     Rate and Rhythm: Normal rate and regular rhythm.  Heart sounds: Normal heart sounds. No murmur heard.   No friction rub. No gallop.  Pulmonary:     Effort: Pulmonary effort is normal.     Breath sounds: Normal breath sounds. No wheezing, rhonchi or rales.  Abdominal:     Palpations: Abdomen is soft.  Musculoskeletal:     Cervical back: Neck supple.  Skin:    General: Skin is warm.     Findings: No rash.  Neurological:     Mental Status: She is alert and oriented to person, place, and time.  Psychiatric:        Mood and Affect: Mood normal.        Behavior: Behavior normal.   Previous notes and tests were reviewed. The plan was reviewed with the patient/family, and all questions/concerned were addressed.  It was my pleasure to see Mindy today and participate in her care. Please feel free to contact me with any questions or concerns.  Sincerely,  Rexene Alberts, DO Allergy & Immunology  [facility] office: [place] office: [phone]

## 2020-11-11 ENCOUNTER — Encounter: Payer: Self-pay | Admitting: Allergy

## 2020-11-11 ENCOUNTER — Other Ambulatory Visit: Payer: Self-pay

## 2020-11-11 ENCOUNTER — Ambulatory Visit (INDEPENDENT_AMBULATORY_CARE_PROVIDER_SITE_OTHER): Payer: BC Managed Care – PPO | Admitting: Allergy

## 2020-11-11 VITALS — BP 124/84 | HR 77 | Temp 98.5°F | Resp 16 | Ht 67.0 in | Wt 303.8 lb

## 2020-11-11 DIAGNOSIS — J310 Chronic rhinitis: Secondary | ICD-10-CM

## 2020-11-11 DIAGNOSIS — Z8709 Personal history of other diseases of the respiratory system: Secondary | ICD-10-CM

## 2020-11-11 DIAGNOSIS — D839 Common variable immunodeficiency, unspecified: Secondary | ICD-10-CM

## 2020-11-11 NOTE — Patient Instructions (Addendum)
CVID Continue with Hyquvia infusions at home every 3 weeks.  Get bloodwork every 4 months - due in January.  Get the day before you are due for your infusion.  Keep track of infections. Will need CT chest as a baseline. Please call the imaging center to schedule. Per insurance you don't need a prior authorization.   Rhinitis: May use over the counter antihistamines such as Zyrtec (cetirizine), Claritin (loratadine), Allegra (fexofenadine), or Xyzal (levocetirizine) daily as needed. May use azelastine nasal spray 1-2 sprays per nostril twice a day as needed for runny nose/drainage.  Follow up in 6 months or sooner if needed.

## 2020-11-11 NOTE — Assessment & Plan Note (Addendum)
Past history - Diagnosed with CVID in 2020 and has been on Hizentra 13gm weekly for the past 6 months with good benefit. Initially had some fatigue and headaches. Last injection on 06/22/2019. Pretreatment levels on 07/09/2018 IgG 459, IgA <40, IgM 28. History of frequent sinusitis, 1 pneumonia, ear infections, meningitis, foot infection requiring hospitalization. 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 History of ITP in 20211, diagnosed with lupus and on Plaquenil. No history of blood clots.  Interim history - had Covid-19 but no antibiotics since last visit. Lumps on the abdominal area resolved with faster infusion rate.  Normal spirometry today.  Continue with Hyquvia 55gm infusions at home every 3 weeks.   Get bloodwork every 4 months - due in January.   Get the day before you are due for your infusion.   Keep track of infections.  Will need CT chest as a baseline. Please call the imaging center to schedule.  Per insurance you don't need a prior authorization.   Recommend annual flu vaccine.

## 2020-11-11 NOTE — Assessment & Plan Note (Signed)
Past history - PND symptoms in the mornings. Patient had skin prick testing and bloodwork in 2020 which was negative to environmental allergens. Interestingly though she had positive testing beforehand and was on immunotherapy for 2 short months.  Interim history - asymptomatic.   May use over the counter antihistamines such as Zyrtec (cetirizine), Claritin (loratadine), Allegra (fexofenadine), or Xyzal (levocetirizine) daily as needed.  May use azelastine nasal spray 1-2 sprays per nostril twice a day as needed for runny nose/drainage.

## 2020-11-14 DIAGNOSIS — D839 Common variable immunodeficiency, unspecified: Secondary | ICD-10-CM | POA: Diagnosis not present

## 2020-11-18 ENCOUNTER — Other Ambulatory Visit: Payer: Self-pay

## 2020-11-18 ENCOUNTER — Ambulatory Visit (HOSPITAL_COMMUNITY)
Admission: RE | Admit: 2020-11-18 | Discharge: 2020-11-18 | Disposition: A | Discharge status: Home / Self Care | Payer: BC Managed Care – PPO | Source: Ambulatory Visit | Attending: Allergy | Admitting: Allergy

## 2020-11-18 DIAGNOSIS — R590 Localized enlarged lymph nodes: Secondary | ICD-10-CM | POA: Diagnosis not present

## 2020-11-18 DIAGNOSIS — R918 Other nonspecific abnormal finding of lung field: Secondary | ICD-10-CM | POA: Diagnosis not present

## 2020-11-18 DIAGNOSIS — D839 Common variable immunodeficiency, unspecified: Secondary | ICD-10-CM | POA: Diagnosis not present

## 2020-11-24 ENCOUNTER — Telehealth: Payer: Self-pay

## 2020-11-24 NOTE — Telephone Encounter (Signed)
-----   Message from Garnet Sierras, DO sent at 11/24/2020  1:22 PM EST ----- Please place referral for pulmonology - Dr. Shearon Stalls or Dr. Vaughan Browner. I already called the patient regarding this.   Referral for: "CT chest showed Bilateral hilar lymphadenopathy. Medical history significant for common variable immunodeficency on IgG replacement and lupus on Plaquenil. Normal spirometry. ? If biopsy needed?

## 2020-12-01 ENCOUNTER — Other Ambulatory Visit: Payer: Self-pay | Admitting: Family Medicine

## 2020-12-03 DIAGNOSIS — D839 Common variable immunodeficiency, unspecified: Secondary | ICD-10-CM | POA: Diagnosis not present

## 2020-12-04 LAB — COMPREHENSIVE METABOLIC PANEL WITH GFR
ALT: 15 [IU]/L (ref 0–32)
AST: 23 [IU]/L (ref 0–40)
Albumin/Globulin Ratio: 2.1 (ref 1.2–2.2)
Albumin: 5 g/dL (ref 3.9–5.0)
Alkaline Phosphatase: 95 [IU]/L (ref 44–121)
BUN/Creatinine Ratio: 7 — ABNORMAL LOW (ref 9–23)
BUN: 7 mg/dL (ref 6–20)
Bilirubin Total: 0.2 mg/dL (ref 0.0–1.2)
CO2: 24 mmol/L (ref 20–29)
Calcium: 9.5 mg/dL (ref 8.7–10.2)
Chloride: 98 mmol/L (ref 96–106)
Creatinine, Ser: 0.94 mg/dL (ref 0.57–1.00)
Globulin, Total: 2.4 g/dL (ref 1.5–4.5)
Glucose: 73 mg/dL (ref 70–99)
Potassium: 4 mmol/L (ref 3.5–5.2)
Sodium: 141 mmol/L (ref 134–144)
Total Protein: 7.4 g/dL (ref 6.0–8.5)
eGFR: 85 mL/min/{1.73_m2}

## 2020-12-04 LAB — CBC WITH DIFFERENTIAL/PLATELET
Basophils Absolute: 0.1 10*3/uL (ref 0.0–0.2)
Basos: 1 %
EOS (ABSOLUTE): 0.1 10*3/uL (ref 0.0–0.4)
Eos: 1 %
Hematocrit: 44.5 % (ref 34.0–46.6)
Hemoglobin: 14.7 g/dL (ref 11.1–15.9)
Immature Grans (Abs): 0 10*3/uL (ref 0.0–0.1)
Immature Granulocytes: 0 %
Lymphocytes Absolute: 1.9 10*3/uL (ref 0.7–3.1)
Lymphs: 25 %
MCH: 27.2 pg (ref 26.6–33.0)
MCHC: 33 g/dL (ref 31.5–35.7)
MCV: 82 fL (ref 79–97)
Monocytes Absolute: 0.8 10*3/uL (ref 0.1–0.9)
Monocytes: 10 %
Neutrophils Absolute: 4.7 10*3/uL (ref 1.4–7.0)
Neutrophils: 63 %
Platelets: 267 10*3/uL (ref 150–450)
RBC: 5.4 x10E6/uL — ABNORMAL HIGH (ref 3.77–5.28)
RDW: 12.8 % (ref 11.7–15.4)
WBC: 7.5 10*3/uL (ref 3.4–10.8)

## 2020-12-04 LAB — IGG, IGA, IGM
IgG (Immunoglobin G), Serum: 1331 mg/dL (ref 586–1602)
IgM (Immunoglobulin M), Srm: 20 mg/dL — ABNORMAL LOW (ref 26–217)
Immunoglobulin A, (IgA) QN, Serum: 29 mg/dL — ABNORMAL LOW (ref 87–352)

## 2020-12-04 LAB — C-REACTIVE PROTEIN: CRP: 9 mg/L (ref 0–10)

## 2020-12-04 LAB — SEDIMENTATION RATE: Sed Rate: 2 mm/hr (ref 0–32)

## 2020-12-05 DIAGNOSIS — D839 Common variable immunodeficiency, unspecified: Secondary | ICD-10-CM | POA: Diagnosis not present

## 2020-12-07 ENCOUNTER — Other Ambulatory Visit: Payer: Self-pay

## 2020-12-07 ENCOUNTER — Ambulatory Visit (INDEPENDENT_AMBULATORY_CARE_PROVIDER_SITE_OTHER): Payer: BC Managed Care – PPO | Admitting: Internal Medicine

## 2020-12-07 ENCOUNTER — Encounter: Payer: Self-pay | Admitting: Internal Medicine

## 2020-12-07 VITALS — BP 130/88 | HR 109 | Temp 98.6°F | Ht 67.0 in | Wt 305.6 lb

## 2020-12-07 DIAGNOSIS — R590 Localized enlarged lymph nodes: Secondary | ICD-10-CM | POA: Diagnosis not present

## 2020-12-07 NOTE — Progress Notes (Signed)
Shirley Carr    [id number]    [birth year]  Primary Care Physician:Kuneff, Reinaldo Raddle, DO  Referring Physician: Ma Hillock, DO [id number],  [place] [id number] Reason for Consultation: "CT chest showed Bilateral hilar lymphadenopathy. Medical history significant for common variable immunodeficency on IgG replacement and lupus on Plaquenil. Normal spirometry. ? If biopsy needed? Date of Consultation: 12/07/2020  Chief complaint:   Chief Complaint  Patient presents with   Consult    Ct done in November     HPI: Shirley Carr is a 30 y.o. woman with CVID diagnosed in 2020 after presenting with tonsil infection. Immunuglobulins were low. Had history of recurrent infections she thought were related to allergies. Led to diagnosis of CVID. Sees Dr. Maudie Mercury and is switching therapies to a subcutaneous form of IVIG. She had a CT Chest prior to starting infusions to obtain as a baseline. Has been on these for about a year and a half. Has been responding well and has only been "sick" three times since starting infusions.   Had covid in July 2022 was not hospitalized and was given anti-viral agent.   No fevers, chills, night sweats or weight loss. Appetite is good. No personal history of cancer.   Started cymbalta about a month ago. And has been feeling stressed. Denies depression.   She did have a colonoscopy in April 2022 for bloody stool which showed pre-cancerous polyps. She is due for another colonoscopy in 4-5 years.   She also has SLE and symptoms include ITP previously, fatigue, joint pains, malar rash.   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  Smoking history: never smoker.   Social History   Occupational History   Not on file  Tobacco Use   Smoking status: Never   Smokeless tobacco: Never  Vaping Use   Vaping Use: Never used  Substance and Sexual Activity   Alcohol use: Yes     Comment: occasionaly   Drug use: Never   Sexual activity: Yes    Partners: Male    Relevant family history:  Family History  Problem Relation Age of Onset   Urticaria Mother    Asthma Mother    Depression Mother    Hypertension Mother    Miscarriages / [place] Mother    Colon polyps Mother    Eczema Brother    Arthritis Brother    Alcohol abuse Father    Heart attack Father    Allergic rhinitis Neg Hx    Colon cancer Neg Hx    Esophageal cancer Neg Hx    Stomach cancer Neg Hx     Past Medical History:  Diagnosis Date   Allergy    Arthritis    Asthma    Blood in stool    Chicken pox    Chronic ITP (idiopathic thrombocytopenia) ([place])    Colon polyps 02/28/2018   Adenoma-repeat colonoscopy 2 years   CVID (common variable immunodeficiency) ([facility])    CVID (common variable immunodeficiency) ([place])    GERD (gastroesophageal reflux disease)    H/O stem cell transplant ([facility]) 2014   Hiatal hernia 02/28/2018   Small hiatal hernia on endoscopy, gastritis.  Biopsies negative for H. 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 pylori and celiac   GANGLION CYST EXCISION Right 2007   Right wrist   HARDWARE REMOVAL Right 2020   Right ankle plate and screws removal   ORIF ANKLE FRACTURE Right 2019   Plates and screws placed (removed in 2020)     Physical Exam: Blood pressure 130/88, pulse (!) 109, temperature 98.6 F (37 C), height $RemoveBe'5\' 7"'YYCmNAauL$  (1.702 m), weight (!) 305 lb 9.6 oz (138.6 kg), SpO2 97  %. Gen:      No acute distress ENT:  no nasal polyps, mucus membranes moist, large tonsils Lymph: no cervical or supraclavicular adenopathy Lungs:    No increased respiratory effort, symmetric chest wall excursion, clear to auscultation bilaterally, no wheezes or crackles CV:         Regular rate and rhythm; no murmurs, rubs, or gallops.  No pedal edema Abd:      + bowel sounds; soft, non-tender; no distension MSK: no acute synovitis of DIP or PIP joints, no mechanics hands.  Skin:      Warm and dry; no rashes Neuro: normal speech, no focal facial asymmetry Psych: alert and oriented x3, normal mood and affect   Data Reviewed/Medical Decision Making:  Independent interpretation of tests: Imaging:  Review of patient's CT Chest Nov 2022 images revealed right and left hilar adenopathy 1.60mm in size. Lungs are clear. The patient's images have been independently reviewed by me.    PFTs: Normal spirometry No flowsheet data found.  Labs:  Immunuglobulins reviewed and low IgM and IgA. IgG improved with supplementation Lab Results  Component Value Date   WBC 7.5 12/03/2020   HGB 14.7 12/03/2020   HCT 44.5 12/03/2020   MCV 82 12/03/2020   PLT 267 12/03/2020   Lab Results  Component Value Date   NA 141 12/03/2020   K 4.0 12/03/2020   CL 98 12/03/2020   CO2 24 12/03/2020     Immunization status:  Immunization History  Administered Date(s) Administered   HPV Quadrivalent 07/23/2006, 10/04/2006   Hepatitis B, ped/adol [birth year], 01/14/1992, 06/11/1992   Hpv-Unspecified 07/23/2006, 10/04/2006, 01/31/2007   IPV 04/13/1992, 06/11/1992, 06/15/1993, 02/19/1996   Influenza,inj,quad, With Preservative 10/04/2015   MMR 03/14/1993, 12/11/1996   Meningococcal Conjugate 07/23/2006, 07/23/2006   Moderna Sars-Covid-2 Vaccination 01/09/2020, 02/06/2020   Tdap 02/13/1992, 04/13/1992, 06/11/1992, 06/15/1993, 02/19/1996, 06/23/2006, 07/23/2006, 01/03/2016, 08/29/2016, 09/09/2016     I  reviewed prior external note(s) from Dr. Scherrie Bateman, PCP  I reviewed the result(s) of the labs and imaging as noted above.   I have ordered CT Chest  Assessment:  Hilar adenopathy SLE on plaquenil CVID on IVIG infusions  Plan/Recommendations:  Sarayu has mild hilar adenopathy which is just above the ULN for enlargement.  Differential diagnosis includes infectious, inflammatory, reactive, less likely maglinancy given the absence of other signs/symptoms of malignancy and personal history.  We discussed bronchoscopy for biopsy vs monitoring given lower likelihood for cancer will repeat CT Chest with contrast in 3 months. If still enlarged consider PET +/- EBUS TBNA.  I am hopeful if this is reactive it should have subsided by then.     Return to Care: Return in about  3 months (around 03/07/2021).  Lenice Llamas, MD [facility] [place]  CC: Raoul Pitch, Renee A, DO

## 2020-12-07 NOTE — Patient Instructions (Addendum)
Please schedule follow up scheduled with myself in 3 months.  If my schedule is not open yet, we will contact you with a reminder closer to that time. Please call [phone] if you haven't heard from [place] a month before.   Before your next visit I would like you to have: CT scan of your chest with contrast - we will call you to schedule this.  Follow up after scan.

## 2020-12-08 ENCOUNTER — Telehealth: Payer: Self-pay | Admitting: Family Medicine

## 2020-12-08 ENCOUNTER — Ambulatory Visit (INDEPENDENT_AMBULATORY_CARE_PROVIDER_SITE_OTHER): Payer: BC Managed Care – PPO | Admitting: Family Medicine

## 2020-12-08 ENCOUNTER — Encounter: Payer: Self-pay | Admitting: Family Medicine

## 2020-12-08 VITALS — BP 121/82 | HR 100 | Temp 98.5°F | Ht 67.0 in | Wt 305.0 lb

## 2020-12-08 DIAGNOSIS — F419 Anxiety disorder, unspecified: Secondary | ICD-10-CM

## 2020-12-08 DIAGNOSIS — Z0289 Encounter for other administrative examinations: Secondary | ICD-10-CM

## 2020-12-08 MED ORDER — DULOXETINE HCL 60 MG PO CPEP: 60.0000 mg | ORAL_CAPSULE | Freq: Every day | ORAL | 1 refills | Status: DC

## 2020-12-08 NOTE — Telephone Encounter (Signed)
Form completed and placed in [place] work basket
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
## 2020-12-08 NOTE — Patient Instructions (Addendum)
   Great to see you today.  I have refilled the medication(s) we provide.   See you in February.

## 2020-12-09 NOTE — Telephone Encounter (Signed)
Form faxed. Pt aware.

## 2020-12-19 DIAGNOSIS — D839 Common variable immunodeficiency, unspecified: Secondary | ICD-10-CM | POA: Diagnosis not present

## 2020-12-26 DIAGNOSIS — D839 Common variable immunodeficiency, unspecified: Secondary | ICD-10-CM | POA: Diagnosis not present

## 2021-01-03 ENCOUNTER — Encounter: Payer: Self-pay | Admitting: Internal Medicine

## 2021-01-16 DIAGNOSIS — D839 Common variable immunodeficiency, unspecified: Secondary | ICD-10-CM | POA: Diagnosis not present

## 2021-02-06 DIAGNOSIS — Z01419 Encounter for gynecological examination (general) (routine) without abnormal findings: Secondary | ICD-10-CM | POA: Diagnosis not present

## 2021-02-06 DIAGNOSIS — D839 Common variable immunodeficiency, unspecified: Secondary | ICD-10-CM | POA: Diagnosis not present

## 2021-02-06 DIAGNOSIS — Z6841 Body Mass Index (BMI) 40.0 and over, adult: Secondary | ICD-10-CM | POA: Diagnosis not present

## 2021-02-08 ENCOUNTER — Other Ambulatory Visit: Payer: Self-pay

## 2021-02-08 ENCOUNTER — Encounter: Payer: Self-pay | Admitting: Allergy

## 2021-02-08 ENCOUNTER — Ambulatory Visit (INDEPENDENT_AMBULATORY_CARE_PROVIDER_SITE_OTHER): Payer: BC Managed Care – PPO | Admitting: Allergy

## 2021-02-08 VITALS — BP 120/76 | HR 95 | Temp 98.0°F | Resp 18

## 2021-02-08 DIAGNOSIS — J310 Chronic rhinitis: Secondary | ICD-10-CM

## 2021-02-08 DIAGNOSIS — D839 Common variable immunodeficiency, unspecified: Secondary | ICD-10-CM | POA: Diagnosis not present

## 2021-02-08 NOTE — Assessment & Plan Note (Signed)
Past history - PND symptoms in the mornings. Patient had skin prick testing and bloodwork in 2020 which was negative to environmental allergens. Interestingly though she had positive testing beforehand and was on immunotherapy for 2 short months.   May use over the counter antihistamines such as Zyrtec (cetirizine), Claritin (loratadine), Allegra (fexofenadine), or Xyzal (levocetirizine) daily as needed.  May use azelastine nasal spray 1-2 sprays per nostril twice a day as needed for runny nose/drainage.

## 2021-02-08 NOTE — Patient Instructions (Addendum)
CVID Get bloodwork We are ordering labs, so please allow 1-2 weeks for the results to come back. With the newly implemented Cures Act, the labs might be visible to you at the same time that they become visible to me. However, I will not address the results until all of the results are back, so please be patient.  In the meantime, continue recommendations in your patient instructions, including avoidance measures (if applicable), until you hear from me. Will let you know about the results. Think about switching to IV infusions every 4 weeks versus continuing with the Hyquvia.  Get bloodwork every 4 months - due in April.  Get the day before you are due for your infusion.  Keep track of infections. Keep appointment for the CT chest and pulmonology. If you notice increased bruising/bleeding - follow up with your rheumatology as well.   Rhinitis: May use over the counter antihistamines such as Zyrtec (cetirizine), Claritin (loratadine), Allegra (fexofenadine), or Xyzal (levocetirizine) daily as needed. May use azelastine nasal spray 1-2 sprays per nostril twice a day as needed for runny nose/drainage.  Follow up in May as scheduled or sooner if needed.

## 2021-02-08 NOTE — Progress Notes (Signed)
Follow Up Note  RE: Shirley Carr MRN: [id number] DOB: Nov 10, 1990 Date of Office Visit: 02/08/2021  Referring provider: Ma Hillock, DO Primary care provider: Ma Hillock, DO  Chief Complaint: No chief complaint on file.  History of Present Illness: I had the pleasure of seeing Shirley Carr for a follow up visit at the [facility] [place] on 02/08/2021. She is a 31 y.o. female, who is being followed for CVID on Hyquvia, non-allergic rhinitis. Her previous allergy office visit was on 11/11/2020 with Dr. Maudie Mercury. Today is a new complaint visit of infusion site bruising .  CVID Patient last infused on Sunday 2/5 and the following morning she noted bruising at the injection site. No itching, erythema or swelling. There was no pain during the infusion.   Patient was on Hizentra injections previously - weekly with 6 injection sites.   Currently on Hyquvia 55gm every 3 weeks - 2 sites. Lasting 1.5 hours.   No infections since the last visit.   Patient is concerned about ITP - and wants to get platelets rechecked. Denies any nosebleeds or excessive bleeding.  Patient follows with rheumatology every 6 months.   Scheduled to have CT chest with contrast next month.   Assessment and Plan: [name] is a 31 y.o. female with: CVID (common variable immunodeficiency) ([place]) Past history - Diagnosed with CVID in 2020 and has been on Hizentra 13gm weekly (6 injection sites). Initially had some fatigue and headaches. Last injection on 06/22/2019. Pretreatment levels on 07/09/2018 IgG 459, IgA <40, IgM 28. History of frequent sinusitis, 1 pneumonia, ear infections, meningitis, foot infection requiring hospitalization. 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 History of ITP in 20211, diagnosed with lupus and on Plaquenil. No history of blood clots. Covid-19 in 2022. Interim history - developed bruising at the injection site. Denies any other excessive bruising or bleeding but  patient concerned about her platelets due to ITP history. Hyquvia infused at 2 injection sites for 1.5 hours. No infections/antibiotics.  Get bloodwork to check platelets.  Patient had issues with some localized irritation with SQ infusions - discussed either switching to IV infusions or to a different SQ product. Patient is interested in IV form.  Get bloodwork every 4 months - due in April.  Get the day before you are due for your infusion.  Keep track of infections. Keep appointment for the CT chest and pulmonology. If you notice increased bruising/bleeding - follow up with your rheumatology as well.   Nonallergic rhinitis Past history - PND symptoms in the mornings. Patient had skin prick testing and bloodwork in 2020 which was negative to environmental allergens. Interestingly though she had positive testing beforehand and was on immunotherapy for 2 short months.  May use over the counter antihistamines such as Zyrtec (cetirizine), Claritin (loratadine), Allegra (fexofenadine), or Xyzal (levocetirizine) daily as needed. May use azelastine nasal spray 1-2 sprays per nostril twice a day as needed for runny nose/drainage.  Return in about 3 months (around 05/08/2021).  No orders of the defined types were placed in this encounter.  Lab Orders         CBC with Differential/Platelet      Diagnostics: None.   Medication List:  Current Outpatient Medications  Medication Sig Dispense Refill   Cholecalciferol (VITAMIN D) 50 MCG (2000 UT) tablet Take 2,000 Units by mouth daily.     DULoxetine (CYMBALTA) 60 MG capsule Take 1 capsule (60 mg total) by mouth daily. 90 capsule 1  hydroxychloroquine (PLAQUENIL) 200 MG tablet Take 400 mg by mouth daily.     Immune Globulin-Hyaluronidase (HYQVIA [place]) Inject 55 g into the skin.     norethindrone (MICRONOR) 0.35 MG tablet Take 1 tablet by mouth daily.     No current facility-administered medications for this visit.   Allergies: Allergies   Allergen Reactions   Pseudoephedrine Other (See Comments)    Joint swelling   I reviewed her past medical history, social history, family history, and environmental history and no significant changes have been reported from her previous visit.  Review of Systems  Constitutional:  Negative for appetite change, chills, fever and unexpected weight change.  HENT:  Negative for congestion, nosebleeds, postnasal drip and rhinorrhea.   Eyes:  Negative for itching.  Respiratory:  Negative for cough, chest tightness, shortness of breath and wheezing.   Cardiovascular:  Negative for chest pain.  Gastrointestinal:  Negative for abdominal pain.  Genitourinary:  Negative for difficulty urinating.  Skin:  Negative for rash.       bruising  Allergic/Immunologic: Negative for environmental allergies and food allergies.  Neurological:  Negative for headaches.   Objective: BP 120/76    Pulse 95    Temp 98 F (36.7 C) (Temporal)    Resp 18    SpO2 98%  There is no height or weight on file to calculate BMI. Physical Exam Vitals and nursing note reviewed.  Constitutional:      Appearance: Normal appearance. She is well-developed. She is obese.  HENT:     Head: Normocephalic and atraumatic.     Right Ear: Tympanic membrane and external ear normal.     Left Ear: Tympanic membrane and external ear normal.     Nose: Nose normal.     Mouth/Throat:     Mouth: Mucous membranes are moist.     Pharynx: Oropharynx is clear.  Eyes:     Conjunctiva/sclera: Conjunctivae normal.  Cardiovascular:     Rate and Rhythm: Normal rate and regular rhythm.     Heart sounds: Normal heart sounds. No murmur heard.   No friction rub. No gallop.  Pulmonary:     Effort: Pulmonary effort is normal.     Breath sounds: Normal breath sounds. No wheezing, rhonchi or rales.  Abdominal:     Palpations: Abdomen is soft.  Musculoskeletal:     Cervical back: Neck supple.  Skin:    General: Skin is warm.     Findings:  Bruising present. No rash.     Comments: 2 ecchymotic areas on the left abdominal area - at the site of infusion. No pain with palpitations. Linear excoriation marks on forearms - apparently from the cat scratching.   Neurological:     Mental Status: She is alert and oriented to person, place, and time.  Psychiatric:        Mood and Affect: Mood normal.        Behavior: Behavior normal.  Previous notes and tests were reviewed. The plan was reviewed with the patient/family, and all questions/concerned were addressed.  It was my pleasure to see Prabhnoor today and participate in her care. Please feel free to contact me with any questions or concerns.  Sincerely,  Rexene Alberts, DO Allergy & Immunology  [facility] office: [place] office: [phone]

## 2021-02-08 NOTE — Assessment & Plan Note (Signed)
Past history - Diagnosed with CVID in 2020 and has been on Hizentra 13gm weekly (6 injection sites). Initially had some fatigue and headaches. Last injection on 06/22/2019. Pretreatment levels on 07/09/2018 IgG 459, IgA <40, IgM 28. History of frequent sinusitis, 1 pneumonia, ear infections, meningitis, foot infection requiring hospitalization. 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 History of ITP in 20211, diagnosed with lupus and on Plaquenil. No history of blood clots. Covid-19 in 2022. Interim history - developed bruising at the injection site. Denies any other excessive bruising or bleeding but patient concerned about her platelets due to ITP history. Hyquvia infused at 2 injection sites for 1.5 hours. No infections/antibiotics.   Get bloodwork to check platelets.   Patient had issues with some localized irritation with SQ infusions - discussed either switching to IV infusions or to a different SQ product.  Patient is interested in IV form.   Get bloodwork every 4 months - due in April.   Get the day before you are due for your infusion.   Keep track of infections.  Keep appointment for the CT chest and pulmonology.  If you notice increased bruising/bleeding - follow up with your rheumatology as well.

## 2021-02-09 ENCOUNTER — Encounter: Payer: Self-pay | Admitting: Allergy

## 2021-02-09 DIAGNOSIS — D839 Common variable immunodeficiency, unspecified: Secondary | ICD-10-CM | POA: Diagnosis not present

## 2021-02-10 ENCOUNTER — Encounter: Payer: Self-pay | Admitting: Allergy

## 2021-02-10 ENCOUNTER — Telehealth: Payer: Self-pay

## 2021-02-10 LAB — CBC WITH DIFFERENTIAL/PLATELET
Basophils Absolute: 0 10*3/uL (ref 0.0–0.2)
Basos: 1 %
EOS (ABSOLUTE): 0.1 10*3/uL (ref 0.0–0.4)
Eos: 1 %
Hematocrit: 44.5 % (ref 34.0–46.6)
Hemoglobin: 14.6 g/dL (ref 11.1–15.9)
Immature Grans (Abs): 0 10*3/uL (ref 0.0–0.1)
Immature Granulocytes: 0 %
Lymphocytes Absolute: 1.9 10*3/uL (ref 0.7–3.1)
Lymphs: 32 %
MCH: 27.5 pg (ref 26.6–33.0)
MCHC: 32.8 g/dL (ref 31.5–35.7)
MCV: 84 fL (ref 79–97)
Monocytes Absolute: 0.4 10*3/uL (ref 0.1–0.9)
Monocytes: 7 %
Neutrophils Absolute: 3.4 10*3/uL (ref 1.4–7.0)
Neutrophils: 59 %
Platelets: 295 10*3/uL (ref 150–450)
RBC: 5.3 x10E6/uL — ABNORMAL HIGH (ref 3.77–5.28)
RDW: 12.8 % (ref 11.7–15.4)
WBC: 5.8 10*3/uL (ref 3.4–10.8)

## 2021-02-10 NOTE — Telephone Encounter (Signed)
[facility] form has been signed by Dr. Maudie Mercury and labeled for scanning. I faxed the form back to Tammy.

## 2021-02-11 NOTE — Telephone Encounter (Signed)
Called and spoke to patient and will be working on change to IVIG possibly at [employer]

## 2021-02-18 ENCOUNTER — Ambulatory Visit (INDEPENDENT_AMBULATORY_CARE_PROVIDER_SITE_OTHER): Payer: BC Managed Care – PPO | Admitting: Family Medicine

## 2021-02-18 ENCOUNTER — Other Ambulatory Visit: Payer: Self-pay

## 2021-02-18 ENCOUNTER — Encounter: Payer: Self-pay | Admitting: Family Medicine

## 2021-02-18 VITALS — BP 122/81 | HR 105 | Temp 98.2°F | Ht 67.0 in | Wt 313.0 lb

## 2021-02-18 DIAGNOSIS — Z131 Encounter for screening for diabetes mellitus: Secondary | ICD-10-CM

## 2021-02-18 DIAGNOSIS — D693 Immune thrombocytopenic purpura: Secondary | ICD-10-CM | POA: Diagnosis not present

## 2021-02-18 DIAGNOSIS — Z0000 Encounter for general adult medical examination without abnormal findings: Secondary | ICD-10-CM

## 2021-02-18 DIAGNOSIS — D839 Common variable immunodeficiency, unspecified: Secondary | ICD-10-CM

## 2021-02-18 DIAGNOSIS — M329 Systemic lupus erythematosus, unspecified: Secondary | ICD-10-CM

## 2021-02-18 DIAGNOSIS — Z6841 Body Mass Index (BMI) 40.0 and over, adult: Secondary | ICD-10-CM | POA: Insufficient documentation

## 2021-02-18 DIAGNOSIS — F410 Panic disorder [episodic paroxysmal anxiety] without agoraphobia: Secondary | ICD-10-CM

## 2021-02-18 DIAGNOSIS — F419 Anxiety disorder, unspecified: Secondary | ICD-10-CM | POA: Diagnosis not present

## 2021-02-18 LAB — LIPID PANEL
Cholesterol: 185 mg/dL (ref 0–200)
HDL: 56 mg/dL (ref >39.00)
LDL Cholesterol: 110 mg/dL — ABNORMAL HIGH (ref 0–99)
NonHDL: 129.02
Total CHOL/HDL Ratio: 3
Triglycerides: 97 mg/dL (ref 0.0–149.0)
VLDL: 19.4 mg/dL (ref 0.0–40.0)

## 2021-02-18 LAB — TSH: TSH: 4.79 u[IU]/mL (ref 0.35–5.50)

## 2021-02-18 LAB — HEMOGLOBIN A1C: Hgb A1c MFr Bld: 5.3 % (ref 4.6–6.5)

## 2021-02-18 NOTE — Progress Notes (Signed)
This visit occurred during the SARS-CoV-2 public health emergency.  Safety protocols were in place, including screening questions prior to the visit, additional usage of staff PPE, and extensive cleaning of exam room while observing appropriate contact time as indicated for disinfecting solutions.    Patient ID: Karena Kinker, female  DOB: 03/12/1990, 31 y.o.   MRN: [id number] Patient Care Team    Relationship Specialty Notifications Start End  Ma Hillock, DO PCP - General Family Medicine  02/18/20   Lahoma Rocker, MD Consulting Physician Rheumatology  02/18/20   Garnet Sierras, DO Consulting Physician Allergy  02/18/20   Marylynn Pearson, MD Consulting Physician Obstetrics and Gynecology  02/18/20   Konrad Felix, MD Referring Physician Ophthalmology  02/18/20   Armbruster, Carlota Raspberry, MD Consulting Physician Gastroenterology  02/18/20   Spero Geralds, MD Consulting Physician Pulmonary Disease  12/08/20     Chief Complaint  Patient presents with   Annual Exam    Pt is fasting    Subjective:  Hannan Tetzlaff is a 31 y.o. female present for CPE. All past medical history, surgical history, allergies, family history, immunizations, medications and social history were updated in the electronic medical record today. All recent labs, ED visits and hospitalizations within the last year were reviewed.  Health maintenance:  Colonoscopy:02/28/2018 in [place]- Dr. Alison Murray Dr. Enis Gash reviewed records 02/02/2020 per EMR and approved pt for scheduling her 2 yr colonoscopy for 4 mm polyp c/w adenoma. I have placed a referral today and encouraged her to call them.  Mammogram: no fhx. Routine screen at 41 Cervical cancer screening: last pap: 02/2020,completed by:Dr. Julien Girt.  Immunizations: tdap UTD 2018, Influenza declined (encouraged yearly), covid series completed Infectious disease screening: HIV and  Hep C screen completed DEXA:per rheumatology recs Assistive device: none Oxygen  JPV:GKKD Patient has a Dental home. Hospitalizations/ED visits: reviewed  Anxiety:feeling well.  Pt presents for follow-up on her new onset anxiety and panic after starting Cymbalta 30 mg 4 weeks ago.   She reports she definitely has noticed a difference since starting Cymbalta and she feels her panic has improved.  She does not wake up panicked any longer.  She still is having significant anxiety when driving, especially if behind large trucks or if the weather is bad. Depression screen [facility] 2/9 12/08/2020 11/04/2020 02/18/2020  Decreased Interest 1 1 0  Down, Depressed, Hopeless 1 1 0  PHQ - 2 Score 2 2 0  Altered sleeping 1 2 -  Tired, decreased energy 2 3 -  Change in appetite 1 3 -  Feeling bad or failure about yourself  1 2 -  Trouble concentrating 1 1 -  Moving slowly or fidgety/restless 0 0 -  Suicidal thoughts 0 0 -  PHQ-9 Score 8 13 -   GAD 7 : Generalized Anxiety Score 12/08/2020 11/04/2020  Nervous, Anxious, on Edge 2 3  Control/stop worrying 2 3  Worry too much - different things 1 3  Trouble relaxing 1 2  Restless 0 1  Easily annoyed or irritable 2 2  Afraid - awful might happen 1 3  Total GAD 7 Score 9 17       Fall Risk  02/18/2020  Falls in the past year? 0  Number falls in past yr: 0  Injury with Fall? 0  Follow up Falls evaluation completed   Immunization History  Administered Date(s) Administered   HPV Quadrivalent 07/23/2006, 10/04/2006   Hepatitis B, ped/adol [birth year], 01/14/1992, 06/11/1992   Hpv-Unspecified 07/23/2006,  10/04/2006, 01/31/2007   IPV 04/13/1992, 06/11/1992, 06/15/1993, 02/19/1996   Influenza,inj,quad, With Preservative 10/04/2015   MMR 03/14/1993, 12/11/1996   Meningococcal Conjugate 07/23/2006, 07/23/2006   Moderna Sars-Covid-2 Vaccination 01/09/2020, 02/06/2020   Tdap 02/13/1992, 04/13/1992, 06/11/1992, 06/15/1993, 02/19/1996, 06/23/2006, 07/23/2006, 01/03/2016, 08/29/2016, 09/09/2016   Past Medical History:  Diagnosis Date    Allergy    Anxiety    Arthritis    Asthma    Blood in stool    Chicken pox    Chronic ITP (idiopathic thrombocytopenia) ([place])    Colon polyps 02/28/2018   Adenoma-repeat colonoscopy 2 years   COVID-19 07/29/2020   CVID (common variable immunodeficiency) ([place])    CVID (common variable immunodeficiency) ([place])    GERD (gastroesophageal reflux disease)    H/O stem cell transplant ([place]) 2014   Hiatal hernia 02/28/2018   Small hiatal hernia on endoscopy, gastritis.  Biopsies negative for H. 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 pylori and celiac   GANGLION CYST EXCISION Right 2007   Right wrist   HARDWARE REMOVAL Right 2020   Right ankle plate and screws removal   ORIF ANKLE FRACTURE Right 2019   Plates and screws placed (removed in 2020)   Family History  Problem Relation Age of Onset   Urticaria Mother    Asthma Mother    Depression Mother    Hypertension Mother    Miscarriages / [place] Mother    Colon polyps Mother    Eczema Brother    Arthritis Brother    Alcohol abuse Father    Heart attack Father    Allergic rhinitis Neg Hx    Colon cancer Neg Hx    Esophageal cancer Neg Hx    Stomach cancer Neg Hx    Social History   Social History Narrative   Marital status/children/pets: single   Education/employment: masters degree. Employed as  a Product manager:      -smoke alarm in the home:No     - wears seatbelt: No     - Feels safe in their relationships: No    Allergies as of 02/18/2021       Reactions   Pseudoephedrine Other (See Comments)   Joint swelling        Medication List        Accurate as of February 18, 2021  9:31 AM. If you have any questions, ask your nurse or doctor.          DULoxetine 60 MG capsule Commonly known as: CYMBALTA Take 1 capsule (60 mg total) by mouth daily.   hydroxychloroquine 200 MG tablet Commonly known as: PLAQUENIL Take 400 mg by mouth daily.   HYQVIA [place] Inject 55 g into the skin.   norethindrone 0.35 MG tablet Commonly known as: MICRONOR Take 1 tablet by mouth  daily.   Vitamin D 50 MCG (2000 UT) tablet Take 2,000 Units by mouth daily.       All past medical history, surgical history, allergies, family history, immunizations andmedications were updated in the EMR today and reviewed under the history and medication portions of their EMR.       ROS 14 pt review of systems performed and negative (unless mentioned in an HPI)  Objective:  BP 122/81    Pulse (!) 105    Temp 98.2 F (36.8 C) (Oral)    Ht 5\' 7"  (1.702 m)    Wt (!) 313 lb (142 kg)    SpO2 100%    BMI 49.02 kg/m   Physical Exam Vitals and nursing note reviewed.  Constitutional:      General: She is not in acute distress.    Appearance: Normal appearance. She is obese. She is not ill-appearing or toxic-appearing.  HENT:     Head: Normocephalic and atraumatic.     Right Ear: Tympanic membrane, ear canal and external ear normal. There is no impacted cerumen.     Left Ear: Tympanic membrane, ear canal and external ear normal. There is no impacted cerumen.     Nose: No congestion or rhinorrhea.     Mouth/Throat:     Mouth: Mucous membranes are moist.     Pharynx: Oropharynx is clear. No oropharyngeal exudate or posterior oropharyngeal erythema.  Eyes:     General: No scleral  icterus.       Right eye: No discharge.        Left eye: No discharge.     Extraocular Movements: Extraocular movements intact.     Conjunctiva/sclera: Conjunctivae normal.     Pupils: Pupils are equal, round, and reactive to light.  Cardiovascular:     Rate and Rhythm: Normal rate and regular rhythm.     Pulses: Normal pulses.     Heart sounds: Normal heart sounds. No murmur heard.   No friction rub. No gallop.  Pulmonary:     Effort: Pulmonary effort is normal. No respiratory distress.     Breath sounds: Normal breath sounds. No stridor. No wheezing, rhonchi or rales.  Chest:     Chest wall: No tenderness.  Abdominal:     General: Abdomen is flat. Bowel sounds are normal. There is no distension.     Palpations: Abdomen is soft. There is no mass.     Tenderness: There is no abdominal tenderness. There is no right CVA tenderness, left CVA tenderness, guarding or rebound.     Hernia: No hernia is present.  Musculoskeletal:        General: No swelling, tenderness or deformity. Normal range of motion.     Cervical back: Normal range of motion and neck supple. No rigidity or tenderness.     Right lower leg: No edema.     Left lower leg: No edema.  Lymphadenopathy:     Cervical: No cervical adenopathy.  Skin:    General: Skin is warm and dry.     Coloration: Skin is not jaundiced or pale.     Findings: No bruising, erythema, lesion or rash.  Neurological:     General: No focal deficit present.     Mental Status: She is alert and oriented to person, place, and time. Mental status is at baseline.     Cranial Nerves: No cranial nerve deficit.     Sensory: No sensory deficit.     Motor: No weakness.  Coordination: Coordination normal.     Gait: Gait normal.     Deep Tendon Reflexes: Reflexes normal.  Psychiatric:        Mood and Affect: Mood normal.        Behavior: Behavior normal.        Thought Content: Thought content normal.        Judgment: Judgment normal.    No  results found.  Assessment/plan: Marishka Rentfrow is a 31 y.o. female present for CPE Immune thrombocytopenic purpura ([place]) CBC is stable.  Anxiety/panic - TSH Cymbalta working well.  She has her f/u scheduled in May Lupus (HCC)/CVID (common variable immunodeficiency) ([place]) Managed by rheum and allergy Morbid obesity (HCC)/BMI 49 Routine exercise and diet  - Lipid panel Diabetes mellitus screening - Hemoglobin A1c Routine general medical examination at a health care facility Colonoscopy:02/28/2018 in [place]- Dr. Alison Murray Dr. Enis Gash reviewed records 02/02/2020 per EMR and approved pt for scheduling her 2 yr colonoscopy for 4 mm polyp c/w adenoma. I have placed a referral today and encouraged her to call them.  Mammogram: no fhx. Routine screen at 41 Cervical cancer screening: last pap: 02/2020,completed by:Dr. Julien Girt.  Immunizations: tdap UTD 2018, Influenza declined (encouraged yearly), covid series completed Infectious disease screening: HIV and  Hep C screen completed DEXA:per rheumatology recs Patient was encouraged to exercise greater than 150 minutes a week. Patient was encouraged to choose a diet filled with fresh fruits and vegetables, and lean meats. AVS provided to patient today for education/recommendation on gender specific health and safety maintenance.    Return in about 1 year (around 02/19/2022) for CPE (30 min).  Orders Placed This Encounter  Procedures   Hemoglobin A1c   Lipid panel   TSH   Orders Placed This Encounter  Procedures   Hemoglobin A1c   Lipid panel   TSH   No orders of the defined types were placed in this encounter.  Referral Orders  No referral(s) requested today     Note is dictated utilizing voice recognition software. Although note has been proof read prior to signing, occasional typographical errors still can be missed. If any questions arise, please do not hesitate to call for verification.  Electronically signed by: Howard Pouch,  DO [place]

## 2021-02-18 NOTE — Patient Instructions (Signed)

## 2021-02-24 DIAGNOSIS — Z1151 Encounter for screening for human papillomavirus (HPV): Secondary | ICD-10-CM | POA: Diagnosis not present

## 2021-02-24 DIAGNOSIS — Z124 Encounter for screening for malignant neoplasm of cervix: Secondary | ICD-10-CM | POA: Diagnosis not present

## 2021-02-24 DIAGNOSIS — Z01419 Encounter for gynecological examination (general) (routine) without abnormal findings: Secondary | ICD-10-CM | POA: Diagnosis not present

## 2021-02-24 DIAGNOSIS — M069 Rheumatoid arthritis, unspecified: Secondary | ICD-10-CM | POA: Insufficient documentation

## 2021-02-24 DIAGNOSIS — Z6841 Body Mass Index (BMI) 40.0 and over, adult: Secondary | ICD-10-CM | POA: Diagnosis not present

## 2021-02-28 ENCOUNTER — Telehealth: Payer: Self-pay | Admitting: Allergy

## 2021-02-28 NOTE — Telephone Encounter (Signed)
[facility] and they have all info but could not advise holdup and had to leave message for the nurse that is handling this patient to call me back

## 2021-02-28 NOTE — Telephone Encounter (Signed)
Tammy,  Can you check if you got a notification of referral from [facility] infusion?  I remember filling a form out but I have a fax that is requesting some additional information.  I will fax this form back to you.  Thank you.

## 2021-03-01 NOTE — Telephone Encounter (Signed)
Spoke to [name] and they are going to try and push her through for her infusion

## 2021-03-10 ENCOUNTER — Other Ambulatory Visit: Payer: Self-pay

## 2021-03-10 ENCOUNTER — Ambulatory Visit (HOSPITAL_COMMUNITY)
Admission: RE | Admit: 2021-03-10 | Discharge: 2021-03-10 | Disposition: A | Discharge status: Home / Self Care | Payer: BC Managed Care – PPO | Source: Ambulatory Visit | Attending: Internal Medicine | Admitting: Internal Medicine

## 2021-03-10 DIAGNOSIS — R590 Localized enlarged lymph nodes: Secondary | ICD-10-CM

## 2021-03-10 DIAGNOSIS — K449 Diaphragmatic hernia without obstruction or gangrene: Secondary | ICD-10-CM | POA: Diagnosis not present

## 2021-03-10 MED ORDER — SODIUM CHLORIDE (PF) 0.9 % IJ SOLN
INTRAMUSCULAR | Status: AC
  Filled 2021-03-10: qty 50

## 2021-03-10 MED ORDER — IOHEXOL 300 MG/ML  SOLN
75.0000 mL | Freq: Once | INTRAMUSCULAR | Status: AC | PRN
  Administered 2021-03-10: 10:00:00 75 mL via INTRAVENOUS

## 2021-03-10 NOTE — Telephone Encounter (Signed)
Spoke to patient and she is scheduled for her infusion at [facility] for tomorrow ?

## 2021-03-11 DIAGNOSIS — D839 Common variable immunodeficiency, unspecified: Secondary | ICD-10-CM | POA: Diagnosis not present

## 2021-03-16 ENCOUNTER — Other Ambulatory Visit: Payer: Self-pay

## 2021-03-16 DIAGNOSIS — R591 Generalized enlarged lymph nodes: Secondary | ICD-10-CM

## 2021-04-08 ENCOUNTER — Other Ambulatory Visit: Payer: Self-pay | Admitting: Allergy

## 2021-04-08 DIAGNOSIS — D839 Common variable immunodeficiency, unspecified: Secondary | ICD-10-CM | POA: Diagnosis not present

## 2021-04-09 LAB — CBC WITH DIFFERENTIAL/PLATELET
Basophils Absolute: 0 10*3/uL (ref 0.0–0.2)
Basos: 0 %
EOS (ABSOLUTE): 0.1 10*3/uL (ref 0.0–0.4)
Eos: 1 %
Hematocrit: 44 % (ref 34.0–46.6)
Hemoglobin: 14.3 g/dL (ref 11.1–15.9)
Immature Grans (Abs): 0 10*3/uL (ref 0.0–0.1)
Immature Granulocytes: 0 %
Lymphocytes Absolute: 1.8 10*3/uL (ref 0.7–3.1)
Lymphs: 29 %
MCH: 27 pg (ref 26.6–33.0)
MCHC: 32.5 g/dL (ref 31.5–35.7)
MCV: 83 fL (ref 79–97)
Monocytes Absolute: 0.3 10*3/uL (ref 0.1–0.9)
Monocytes: 5 %
Neutrophils Absolute: 4.1 10*3/uL (ref 1.4–7.0)
Neutrophils: 65 %
Platelets: 290 10*3/uL (ref 150–450)
RBC: 5.29 x10E6/uL — ABNORMAL HIGH (ref 3.77–5.28)
RDW: 12.4 % (ref 11.7–15.4)
WBC: 6.3 10*3/uL (ref 3.4–10.8)

## 2021-04-09 LAB — COMPREHENSIVE METABOLIC PANEL
ALT: 16 IU/L (ref 0–32)
AST: 17 IU/L (ref 0–40)
Albumin/Globulin Ratio: 1.9 (ref 1.2–2.2)
Albumin: 4.4 g/dL (ref 3.9–5.0)
Alkaline Phosphatase: 90 IU/L (ref 44–121)
BUN/Creatinine Ratio: 10 (ref 9–23)
BUN: 9 mg/dL (ref 6–20)
Bilirubin Total: 0.3 mg/dL (ref 0.0–1.2)
CO2: 21 mmol/L (ref 20–29)
Calcium: 9.6 mg/dL (ref 8.7–10.2)
Chloride: 101 mmol/L (ref 96–106)
Creatinine, Ser: 0.92 mg/dL (ref 0.57–1.00)
Globulin, Total: 2.3 g/dL (ref 1.5–4.5)
Glucose: 123 mg/dL — ABNORMAL HIGH (ref 70–99)
Potassium: 4.2 mmol/L (ref 3.5–5.2)
Sodium: 140 mmol/L (ref 134–144)
Total Protein: 6.7 g/dL (ref 6.0–8.5)
eGFR: 86 mL/min/{1.73_m2} (ref >59)

## 2021-04-09 LAB — C-REACTIVE PROTEIN: CRP: 12 mg/L — ABNORMAL HIGH (ref 0–10)

## 2021-04-09 LAB — IGG, IGA, IGM
IgA/Immunoglobulin A, Serum: 26 mg/dL — ABNORMAL LOW (ref 87–352)
IgG (Immunoglobin G), Serum: 1033 mg/dL (ref 586–1602)
IgM (Immunoglobulin M), Srm: 20 mg/dL — ABNORMAL LOW (ref 26–217)

## 2021-04-09 LAB — SEDIMENTATION RATE: Sed Rate: 3 mm/hr (ref 0–32)

## 2021-04-20 ENCOUNTER — Encounter: Payer: Self-pay | Admitting: Family Medicine

## 2021-04-22 ENCOUNTER — Ambulatory Visit (INDEPENDENT_AMBULATORY_CARE_PROVIDER_SITE_OTHER): Payer: BC Managed Care – PPO | Admitting: Family Medicine

## 2021-04-22 ENCOUNTER — Encounter: Payer: Self-pay | Admitting: Family Medicine

## 2021-04-22 VITALS — BP 104/74 | HR 101 | Temp 98.2°F | Ht 67.0 in | Wt 317.0 lb

## 2021-04-22 DIAGNOSIS — F410 Panic disorder [episodic paroxysmal anxiety] without agoraphobia: Secondary | ICD-10-CM

## 2021-04-22 DIAGNOSIS — F419 Anxiety disorder, unspecified: Secondary | ICD-10-CM

## 2021-04-22 MED ORDER — DULOXETINE HCL 20 MG PO CPEP: 40.0000 mg | ORAL_CAPSULE | Freq: Every day | ORAL | 1 refills | Status: DC

## 2021-04-22 NOTE — Progress Notes (Signed)
? ?This visit occurred during the SARS-CoV-2 public health emergency.  Safety protocols were in place, including screening questions prior to the visit, additional usage of staff PPE, and extensive cleaning of exam room while observing appropriate contact time as indicated for disinfecting solutions.  ? ? ?Patient ID: Shirley Carr, female  DOB: [birth year], 31 y.o.   MRN: [id number] ?Patient Care Team  ?  Relationship Specialty Notifications Start End  ?Ma Hillock, DO PCP - General Family Medicine  02/18/20   ?Lahoma Rocker, MD Consulting Physician Rheumatology  02/18/20   ?Garnet Sierras, DO Consulting Physician Allergy  02/18/20   ?Marylynn Pearson, MD Consulting Physician Obstetrics and Gynecology  02/18/20   ?Konrad Felix, MD Referring Physician Ophthalmology  02/18/20   ?Yetta Flock, MD Consulting Physician Gastroenterology  02/18/20   ?Spero Geralds, MD Consulting Physician Pulmonary Disease  12/08/20   ? ? ?Chief Complaint  ?Patient presents with  ? Anxiety  ? ? ?Subjective: ? ?Shirley Carr is a 31 y.o. female present for anxiety follow up ?All past medical history, surgical history, allergies, family history, immunizations, medications and social history were updated in the electronic medical record today. ?All recent labs, ED visits and hospitalizations within the last year were reviewed. ? ? ? ?Anxiety: pt feels the increase dose of Cymbalta is making her more fatigued and her family has noted her emotions are blunted. She would like to consider a lowered dose between 30 mg and 60 mg.  ?Prior note: ?Pt presents for follow-up on her new onset anxiety and panic after starting Cymbalta 30 mg 4 weeks ago.   She reports she definitely has noticed a difference since starting Cymbalta and she feels her panic has improved.  She does not wake up panicked any longer.  She still is having significant anxiety when driving, especially if behind large trucks or if the weather is bad.  ? ? ?  04/22/2021  ?  8:16  AM 12/08/2020  ?  9:25 AM 11/04/2020  ? 10:02 AM 02/18/2020  ? 10:39 AM  ?Depression screen PHQ 2/9  ?Decreased Interest _0 0  ?Down, Depressed, Hopeless _1 0  ?PHQ - 2 Score _2 0  ?Altered sleeping _3 ?Tired, decreased energy _4 ?Change in appetite _5 ?Feeling bad or failure about yourself  _6 ?Trouble concentrating _7 ?Moving slowly or fidgety/restless 1 0 0   ?Suicidal thoughts 0 0 0   ?PHQ-9 Score _8 ? ? ?  04/22/2021  ?  8:16 AM 12/08/2020  ?  9:27 AM 11/04/2020  ? 10:02 AM  ?GAD 7 : Generalized Anxiety Score  ?Nervous, Anxious, on Edge 0 2 3  ?Control/stop worrying 0 2 3  ?Worry too much - different things 0 1 3  ?Trouble relaxing 0 1 2  ?Restless 0 0 1  ?Easily annoyed or irritable _9 ?Afraid - awful might happen 0 1 3  ?Total GAD 7 Score _10 ? ?  ?  ? ?  02/18/2020  ? 12:58 PM  ?Fall Risk   ?Falls in the past year? 0  ?Number falls in past yr: 0  ?Injury with Fall? 0  ?Follow up Falls evaluation completed  ? ?Immunization History  ?Administered Date(s) Administered  ? HPV Quadrivalent 07/23/2006, 10/04/2006  ? Hepatitis B, ped/adol [birth year] 1991,  01/14/1992, 06/11/1992  ? Hpv-Unspecified 07/23/2006, 10/04/2006, 01/31/2007  ? IPV 04/13/1992, 06/11/1992, 06/15/1993, 02/19/1996  ? Influenza,inj,quad, With Preservative 10/04/2015  ? MMR 03/14/1993, 12/11/1996  ? Meningococcal Conjugate 07/23/2006, 07/23/2006  ? Moderna Sars-Covid-2 Vaccination 01/09/2020, 02/06/2020  ? Tdap 02/13/1992, 04/13/1992, 06/11/1992, 06/15/1993, 02/19/1996, 06/23/2006, 07/23/2006, 01/03/2016, 08/29/2016, 09/09/2016  ? ?Past Medical History:  ?Diagnosis Date  ? Allergy   ? Anxiety   ? Arthritis   ? Asthma   ? Blood in stool   ? Chicken pox   ? Chronic ITP (idiopathic thrombocytopenia) (HCC)   ? Colon polyps 02/28/2018  ? Adenoma-repeat colonoscopy 2 years  ? COVID-19 07/29/2020  ? CVID (common variable immunodeficiency) ([place])   ? CVID (common variable immunodeficiency) ([place])   ? GERD  (gastroesophageal reflux disease)   ? H/O stem cell transplant [facility]) 2014  ? Hiatal hernia 02/28/2018  ? Small hiatal hernia on endoscopy, gastritis.  Biopsies negative for H. pylori and celiac  ? History of asthma 06/26/2019  ? Lupus ([place])   ? Lupus encephalitis ([place]) 2013  ? Hospitalized  ? Osteomyelitis ([place]) 2018  ? Hospitalized foot/ankle  ? Polyp of colon 02/27/2018  ? Raynaud's disease   ? Recurrent upper respiratory infection (URI)   ? UTI (urinary tract infection)   ? ?Allergies  ?Allergen Reactions  ? Pseudoephedrine Other (See Comments)  ?  Joint swelling  ? ?Past Surgical History:  ?Procedure Laterality Date  ? COLONOSCOPY  02/28/2018  ? Adenoma-repeat colonoscopy 2 years  ? CYST EXCISION Right 2020  ? Benign cyst excision right arm x2  ? ESOPHAGOGASTRODUODENOSCOPY  02/28/2018  ? Small hiatal hernia, mild gastritis, biopsies negative for H. pylori and celiac  ? GANGLION CYST EXCISION Right 2007  ? Right wrist  ? HARDWARE REMOVAL Right 2020  ? Right ankle plate and screws removal  ? ORIF ANKLE FRACTURE Right 2019  ? Plates and screws placed (removed in 2020)  ? ?Family History  ?Problem Relation Age of Onset  ? Urticaria Mother   ? Asthma Mother   ? Depression Mother   ? Hypertension Mother   ? Miscarriages / [place] Mother   ? Colon polyps Mother   ? Eczema Brother   ? Arthritis Brother   ? Alcohol abuse Father   ? Heart attack Father   ? Allergic rhinitis Neg Hx   ? Colon cancer Neg Hx   ? Esophageal cancer Neg Hx   ? Stomach cancer Neg Hx   ? ?Social History  ? ?Social History Narrative  ? Marital status/children/pets: single  ? Education/employment: masters degree. Employed as a Arts development officer closer  ? Safety:   ?   -smoke alarm in the home:No  ?   - wears seatbelt: No  ?   - Feels safe in their relationships: No  ? ? ?Allergies as of 04/22/2021   ? ?   Reactions  ? Pseudoephedrine Other (See Comments)  ? Joint swelling  ? ?  ? ?  ?Medication List  ?  ? ?  ? Accurate as of April 22, 2021  8:43 AM. If  you have any questions, ask your nurse or doctor.  ?  ?  ? ?  ? ?STOP taking these medications   ? ?HYQVIA [place] ?Stopped by: Howard Pouch, DO ?  ? ?  ? ?TAKE these medications   ? ?DULoxetine 20 MG capsule ?Commonly known as: CYMBALTA ?Take 2 capsules (40 mg total) by mouth daily. ?What changed:  ?medication strength ?how much to take ?  Changed by: Howard Pouch, DO ?  ?hydroxychloroquine 200 MG tablet ?Commonly known as: PLAQUENIL ?Take 400 mg by mouth daily. ?  ?Immune Globulin 10% 10G/120m (10,0071m100mL) Soln ?Generic drug: Immune Globulin 10% ?Inject into the vein. ?  ?norethindrone 0.35 MG tablet ?Commonly known as: MICRONOR ?Take 1 tablet by mouth daily. ?  ?Vitamin D 50 MCG (2000 UT) tablet ?Take 2,000 Units by mouth daily. ?  ? ?  ? ?All past medical history, surgical history, allergies, family history, immunizations andmedications were updated in the EMR today and reviewed under the history and medication portions of their EMR.    ? ? ?ROS ?14 pt review of systems performed and negative (unless mentioned in an HPI) ?Objective: ?BP 104/74   Pulse (!) 101   Temp 98.2 ?F (36.8 ?C) (Oral)   Ht _0  (1.702 m)   Wt (!) 317 lb (143.8 kg)   SpO2 99%   BMI 49.65 kg/m?  ?Physical Exam ?Vitals and nursing note reviewed.  ?Constitutional:   ?   General: She is not in acute distress. ?   Appearance: Normal appearance. She is obese. She is not ill-appearing or toxic-appearing.  ?HENT:  ?   Head: Normocephalic and atraumatic.  ?   Nose: No congestion or rhinorrhea.  ?Eyes:  ?   General: No scleral icterus.    ?   Right eye: No discharge.     ?   Left eye: No discharge.  ?Cardiovascular:  ?   Rate and Rhythm: Normal rate.  ?Abdominal:  ?   Tenderness: There is no right CVA tenderness or left CVA tenderness.  ?Musculoskeletal:  ?   Right lower leg: No edema.  ?   Left lower leg: No edema.  ?Skin: ?   Findings: No rash.  ?Neurological:  ?   General: No focal deficit present.  ?   Mental Status: She is alert and  oriented to person, place, and time. Mental status is at baseline.  ?   Cranial Nerves: No cranial nerve deficit.  ?   Sensory: No sensory deficit.  ?   Motor: No weakness.  ?   Coordination: Coordination normal.  ?   Gait: Gait

## 2021-04-22 NOTE — Patient Instructions (Signed)

## 2021-05-06 DIAGNOSIS — D839 Common variable immunodeficiency, unspecified: Secondary | ICD-10-CM | POA: Diagnosis not present

## 2021-05-09 NOTE — Progress Notes (Signed)
? ?Follow Up Note ? ?RE: Shirley Carr MRN: [id number] DOB: 03-02-1990 ?Date of Office Carr: 05/10/2021 ? ?Referring provider: Ma Hillock, DO ?Primary care provider: Ma Hillock, DO ? ?Chief Complaint: Other (Started IVIG in march and had Shirley Carr 3rd one last Friday. Has been going good since the switch. ) ? ?History of Present Illness: ?I had the pleasure of seeing Shirley Carr at the [facility] on 05/10/2021. Shirley Carr is a 31 y.o. female, who is being followed for CVID on IVIG, nonallergic rhinitis. Shirley Carr previous allergy office Carr was on 02/08/2021 with Dr. Maudie Mercury. Today is a regular follow up Carr. ? ?CVID  ?Currently on IVIG and started in March with no issues. It is closer to Shirley Carr work and usually gets it done on Fridays.  ?Shirley Carr had some headaches and fatigue after the first infusion and now only gets a headache for 1 day.  ? ?Denies any bruising, fevers/chills, lymphadenopathy, trouble breathing. ?All the abdominal bruising and knots resolved now that Shirley Carr is no longer doing SQ injections.  ? ?No infections or antibiotics since the last Carr. ? ?Patient had CT chest in March which showed bilateral axillary lymphadenopathy - unchanged from November. Scheduled for another CT chest.  ? ?Follows with rheumatology for lupus. ? ?Nonallergic rhinitis ?Asymptomatic.  ? ?Mood is doing better.  ? ?03/10/2021 CT chest: ?"IMPRESSION: ?1. Bilateral axillary lymphadenopathy is unchanged from 11/18/2020. ?This is of uncertain etiology. There is no mediastinal or hilar ?lymphadenopathy. ?2. Stable small hiatal hernia. ?3. The lungs are clear." ? ?Assessment and Plan: ?Shirley Carr is a 31 y.o. female with: ?CVID (common variable immunodeficiency) ([place]) ?Past history - Diagnosed with CVID in 2020 and has been on Hizentra 13gm weekly (6 injection sites). Initially had some fatigue and headaches. Last injection on 06/22/2019. Pretreatment levels on 07/09/2018 IgG 459, IgA <40, IgM 28. History of  frequent sinusitis, 1 pneumonia, ear infections, meningitis, foot infection requiring hospitalization. 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 History of ITP in 20211, diagnosed with lupus and on Plaquenil. No history of blood clots. Covid-19 in 2022. Hyquvia caused localized irritation. ?Interim history - on IVIG since March with no issues. No infections/antibiotics.  ?Continue IVIG infusions every 4 weeks. ?Get bloodwork every 4 months - due in August.  ?Get before you are due for your infusion.  ?Ask if infusion center if they can draw blood before your infusion. ?Keep track of infections. ?Keep appointment for the repeat CT chest.  ?Get spirometry once per year.  ? ?Nonallergic rhinitis ?Past history - PND symptoms in the mornings. Patient had skin prick testing and bloodwork in 2020 which was negative to environmental allergens. Interestingly though Shirley Carr had positive testing beforehand and was on immunotherapy for 2 short months.  ?Interim history - asymptomatic.  ?May use over the counter antihistamines such as Zyrtec (cetirizine), Claritin (loratadine), Allegra (fexofenadine), or Xyzal (levocetirizine) daily as needed. ?May use azelastine nasal spray 1-2 sprays per nostril twice a day as needed for runny nose/drainage. ? ?Lupus ([place]) ?On plaquenil - managed by rheumatology. Stable. ? ?Return in about 4 months (around 09/10/2021). ? ?No orders of the defined types were placed in this encounter. ? ?Lab Orders    ?     CBC with Differential/Platelet    ?     Comprehensive metabolic panel    ?     C-reactive protein    ?     Sedimentation rate    ?  IgG, IgA, IgM    ? ? ?Diagnostics: ?None.  ? ? ?Medication List:  ?Current Outpatient Medications  ?Medication Sig Dispense Refill  ? Cholecalciferol (VITAMIN D) 50 MCG (2000 UT) tablet Take 2,000 Units by mouth daily.    ? DULoxetine (CYMBALTA) 20 MG capsule Take 2 capsules (40 mg total) by mouth daily. 180 capsule 1  ? hydroxychloroquine (PLAQUENIL) 200  MG tablet Take 400 mg by mouth daily.    ? Immune Globulin 10% 10G/133m (10,'000mg'$ /1015m SOLN Inject into the vein.    ? norethindrone (MICRONOR) 0.35 MG tablet Take 1 tablet by mouth daily.    ? ?No current facility-administered medications for this Carr.  ? ?Allergies: ?Allergies  ?Allergen Reactions  ? Pseudoephedrine Other (See Comments)  ?  Joint swelling  ? ?I reviewed Shirley Carr past medical history, social history, family history, and environmental history and no significant changes have been reported from Shirley Carr previous Carr. ? ?Review of Systems  ?Constitutional:  Negative for appetite change, chills, fever and unexpected weight change.  ?HENT:  Negative for congestion, nosebleeds, postnasal drip and rhinorrhea.   ?Eyes:  Negative for itching.  ?Respiratory:  Negative for cough, chest tightness, shortness of breath and wheezing.   ?Cardiovascular:  Negative for chest pain.  ?Gastrointestinal:  Negative for abdominal pain.  ?Genitourinary:  Negative for difficulty urinating.  ?Skin:  Negative for rash.  ?Allergic/Immunologic: Negative for environmental allergies and food allergies.  ?Neurological:  Negative for headaches.  ? ?Objective: ?BP 126/86   Pulse 89   Temp 98.3 ?F (36.8 ?C)   Resp 18   Ht '5\' 8"'$  (1.727 m)   Wt (!) 318 lb 12.8 oz (144.6 kg)   SpO2 97%   BMI 48.47 kg/m?  ?Body mass index is 48.47 kg/m?. Marland KitchenPhysical Exam ?Vitals and nursing note reviewed.  ?Constitutional:   ?   Appearance: Normal appearance. Shirley Carr is well-developed. Shirley Carr is obese.  ?HENT:  ?   Head: Normocephalic and atraumatic.  ?   Right Ear: Tympanic membrane and external ear normal.  ?   Left Ear: Tympanic membrane and external ear normal.  ?   Nose: Nose normal.  ?   Mouth/Throat:  ?   Mouth: Mucous membranes are moist.  ?   Pharynx: Oropharynx is clear.  ?Eyes:  ?   Conjunctiva/sclera: Conjunctivae normal.  ?Cardiovascular:  ?   Rate and Rhythm: Normal rate and regular rhythm.  ?   Heart sounds: Normal heart sounds. No murmur  heard. ?  No friction rub. No gallop.  ?Pulmonary:  ?   Effort: Pulmonary effort is normal.  ?   Breath sounds: Normal breath sounds. No wheezing, rhonchi or rales.  ?Abdominal:  ?   Palpations: Abdomen is soft.  ?Musculoskeletal:  ?   Cervical back: Neck supple.  ?Skin: ?   General: Skin is warm.  ?   Findings: No bruising or rash.  ?Neurological:  ?   Mental Status: Shirley Carr is alert and oriented to person, place, and time.  ?Psychiatric:     ?   Mood and Affect: Mood normal.     ?   Behavior: Behavior normal.  ? ?Previous notes and tests were reviewed. ?The plan was reviewed with the patient/family, and all questions/concerned were addressed. ? ?It was my pleasure to see Shirley Carr and participate in Shirley Carr care. Please feel free to contact me with any questions or concerns. ? ?Sincerely, ? ?YoRexene AlbertsDO ?Allergy & Immunology ? ?[facility]  office: [phone] ?[place] office: [phone] ?

## 2021-05-10 ENCOUNTER — Other Ambulatory Visit: Payer: Self-pay

## 2021-05-10 ENCOUNTER — Encounter: Payer: Self-pay | Admitting: Allergy

## 2021-05-10 ENCOUNTER — Ambulatory Visit (INDEPENDENT_AMBULATORY_CARE_PROVIDER_SITE_OTHER): Payer: BC Managed Care – PPO | Admitting: Allergy

## 2021-05-10 VITALS — BP 126/86 | HR 89 | Temp 98.3°F | Resp 18 | Ht 68.0 in | Wt 318.8 lb

## 2021-05-10 DIAGNOSIS — D839 Common variable immunodeficiency, unspecified: Secondary | ICD-10-CM

## 2021-05-10 DIAGNOSIS — M329 Systemic lupus erythematosus, unspecified: Secondary | ICD-10-CM | POA: Diagnosis not present

## 2021-05-10 DIAGNOSIS — J310 Chronic rhinitis: Secondary | ICD-10-CM | POA: Diagnosis not present

## 2021-05-10 NOTE — Patient Instructions (Addendum)
CVID ?Continue IV infusions every 4 weeks. ?Get bloodwork every 4 months - due in August.  ?Get before you are due for your infusion.  ?Ask if infusion center if they can draw blood before your infusion. ?Keep track of infections. ?Keep appointment for the repeat CT chest.  ? ?Rhinitis: ?May use over the counter antihistamines such as Zyrtec (cetirizine), Claritin (loratadine), Allegra (fexofenadine), or Xyzal (levocetirizine) daily as needed. ?May use azelastine nasal spray 1-2 sprays per nostril twice a day as needed for runny nose/drainage. ? ?Follow up in September or sooner if needed.  ? ?

## 2021-05-10 NOTE — Assessment & Plan Note (Signed)
Past history - PND symptoms in the mornings. Patient had skin prick testing and bloodwork in 2020 which was negative to environmental allergens. Interestingly though she had positive testing beforehand and was on immunotherapy for 2 short months.  ?Interim history - asymptomatic.  ?? May use over the counter antihistamines such as Zyrtec (cetirizine), Claritin (loratadine), Allegra (fexofenadine), or Xyzal (levocetirizine) daily as needed. ?? May use azelastine nasal spray 1-2 sprays per nostril twice a day as needed for runny nose/drainage. ?

## 2021-05-10 NOTE — Assessment & Plan Note (Addendum)
Past history - Diagnosed with CVID in 2020 and has been on Hizentra 13gm weekly (6 injection sites). Initially had some fatigue and headaches. Last injection on 06/22/2019. Pretreatment levels on 07/09/2018 IgG 459, IgA <40, IgM 28. History of frequent sinusitis, 1 pneumonia, ear infections, meningitis, foot infection requiring hospitalization. 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 History of ITP in 20211, diagnosed with lupus and on Plaquenil. No history of blood clots. Covid-19 in 2022. Hyquvia caused localized irritation. ?Interim history - on IVIG since March with no issues. No infections/antibiotics.  ?? Continue IVIG infusions every 4 weeks. ?? Get bloodwork every 4 months - due in August.  ?? Get before you are due for your infusion.  ?? Ask if infusion center if they can draw blood before your infusion. ?? Keep track of infections. ?? Keep appointment for the repeat CT chest.  ?? Get spirometry once per year.  ?

## 2021-05-25 ENCOUNTER — Ambulatory Visit: Payer: BC Managed Care – PPO | Admitting: Family Medicine

## 2021-06-03 DIAGNOSIS — D839 Common variable immunodeficiency, unspecified: Secondary | ICD-10-CM | POA: Diagnosis not present

## 2021-06-13 ENCOUNTER — Encounter: Payer: Self-pay | Admitting: Allergy

## 2021-06-13 DIAGNOSIS — M329 Systemic lupus erythematosus, unspecified: Secondary | ICD-10-CM | POA: Diagnosis not present

## 2021-06-13 DIAGNOSIS — Z79899 Other long term (current) drug therapy: Secondary | ICD-10-CM | POA: Diagnosis not present

## 2021-06-13 DIAGNOSIS — Z862 Personal history of diseases of the blood and blood-forming organs and certain disorders involving the immune mechanism: Secondary | ICD-10-CM | POA: Diagnosis not present

## 2021-06-13 DIAGNOSIS — D839 Common variable immunodeficiency, unspecified: Secondary | ICD-10-CM | POA: Diagnosis not present

## 2021-06-13 NOTE — Progress Notes (Signed)
Reviewed notes from  rheumatology - Dr. Gavin Pound . Date of service: 06/13/2021. See scanned notes for full documentation. Continuing plaquenil '400mg'$  daily and getting some bloodwork.

## 2021-07-01 DIAGNOSIS — D839 Common variable immunodeficiency, unspecified: Secondary | ICD-10-CM | POA: Diagnosis not present

## 2021-07-21 ENCOUNTER — Other Ambulatory Visit: Payer: Self-pay

## 2021-07-22 ENCOUNTER — Encounter: Payer: Self-pay | Admitting: Family Medicine

## 2021-07-29 ENCOUNTER — Ambulatory Visit (INDEPENDENT_AMBULATORY_CARE_PROVIDER_SITE_OTHER): Payer: BC Managed Care – PPO | Admitting: Family Medicine

## 2021-07-29 ENCOUNTER — Encounter: Payer: Self-pay | Admitting: Family Medicine

## 2021-07-29 VITALS — Temp 98.2°F | Ht 68.0 in | Wt 320.0 lb

## 2021-07-29 DIAGNOSIS — R112 Nausea with vomiting, unspecified: Secondary | ICD-10-CM | POA: Diagnosis not present

## 2021-07-29 DIAGNOSIS — M329 Systemic lupus erythematosus, unspecified: Secondary | ICD-10-CM

## 2021-07-29 DIAGNOSIS — D693 Immune thrombocytopenic purpura: Secondary | ICD-10-CM

## 2021-07-29 DIAGNOSIS — R1012 Left upper quadrant pain: Secondary | ICD-10-CM

## 2021-07-29 DIAGNOSIS — D839 Common variable immunodeficiency, unspecified: Secondary | ICD-10-CM | POA: Diagnosis not present

## 2021-07-29 DIAGNOSIS — R161 Splenomegaly, not elsewhere classified: Secondary | ICD-10-CM

## 2021-07-29 NOTE — Progress Notes (Signed)
Shirley Carr , 14-Apr-1990, 31 y.o., female MRN: [id number] Patient Care Team    Relationship Specialty Notifications Start End  Ma Hillock, DO PCP - General Family Medicine  02/18/20   Lahoma Rocker, MD Consulting Physician Rheumatology  02/18/20   Garnet Sierras, DO Consulting Physician Allergy  02/18/20   Marylynn Pearson, MD Consulting Physician Obstetrics and Gynecology  02/18/20   Konrad Felix, MD Referring Physician Ophthalmology  02/18/20   Havery Moros Carlota Raspberry, MD Consulting Physician Gastroenterology  02/18/20   Spero Geralds, MD Consulting Physician Pulmonary Disease  12/08/20     Chief Complaint  Patient presents with   Abdominal Pain    Pt c/o LUQ abd pain and nausea x 3 weeks;     Subjective: Pt presents for an OV with complaints of left upper quadrant pain of 3 weeks duration.  She states it was intermittent discomfort until the last week.  She describes the discomfort as a consistent pressure feeling in her left upper quadrant.  This pressure feeling is worse if sitting long periods of time or laying on her left side..  She reports feeling nauseated at times from the pressure feeling.  She denies any bowel habit changes, vomit, rash, bruising, fevers or chills.  She is eating and drinking are normal.  She states she has a daily BM and she does not feel like she is straining.  Last BM was this morning.  LMP was 4 weeks ago. Patient has a significant medical history of lupus and had been told in the past that she had an enlarged spleen.  No records are available at this time.  No LMP recorded. (Menstrual status: Oral contraceptives).      04/22/2021    8:16 AM 12/08/2020    9:25 AM 11/04/2020   10:02 AM 02/18/2020   10:39 AM  Depression screen PHQ 2/9  Decreased Interest 1 1 1  0  Down, Depressed, Hopeless 1 1 1  0  PHQ - 2 Score 2 2 2  0  Altered sleeping 2 1 2    Tired, decreased energy 3 2 3    Change in appetite 1 1 3    Feeling bad or failure about yourself  1  1 2    Trouble concentrating 3 1 1    Moving slowly or fidgety/restless 1 0 0   Suicidal thoughts 0 0 0   PHQ-9 Score 13 8 13      Allergies  Allergen Reactions   Pseudoephedrine Other (See Comments)    Joint swelling   Social History   Social History Narrative   Marital status/children/pets: single   Education/employment: masters degree. Employed as a Product manager:      -smoke alarm in the home:No     - wears seatbelt: No     - Feels safe in their relationships: No   Past Medical History:  Diagnosis Date   Allergy    Anxiety    Arthritis    Blood in stool    Chicken pox    Chronic ITP (idiopathic thrombocytopenia) (HCC)    Colon polyps 02/28/2018   Adenoma-repeat colonoscopy 2 years   COVID-19 07/29/2020   CVID (common variable immunodeficiency) ([place])    GERD (gastroesophageal reflux disease)    H/O stem cell transplant ([place]) 2014   Hiatal hernia 02/28/2018   Small hiatal hernia on endoscopy, gastritis.  Biopsies negative for H. 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 pylori and celiac   GANGLION CYST EXCISION Right 2007   Right wrist   HARDWARE REMOVAL Right 2020   Right ankle plate and screws removal   ORIF ANKLE FRACTURE Right 2019   Plates and screws placed (removed in 2020)   Family History  Problem Relation Age of Onset   Urticaria Mother    Asthma Mother    Depression Mother    Hypertension Mother     Miscarriages / [place] Mother    Colon polyps Mother    Eczema Brother    Arthritis Brother    Alcohol abuse Father    Heart attack Father    Allergic rhinitis Neg Hx    Colon cancer Neg Hx    Esophageal cancer Neg Hx    Stomach cancer Neg Hx    Allergies as of 07/29/2021       Reactions   Pseudoephedrine Other (See Comments)   Joint swelling        Medication List        Accurate as of July 29, 2021  5:56 PM. If you have any questions, ask your nurse or doctor.          STOP taking these medications    norethindrone 0.35 MG tablet Commonly known as: MICRONOR Stopped by: Howard Pouch, DO       TAKE these medications    DULoxetine 20 MG capsule Commonly known as: CYMBALTA Take 2 capsules (40 mg total) by mouth daily.   hydroxychloroquine 200 MG tablet Commonly known as: PLAQUENIL Take 400 mg by mouth daily.   Immune Globulin 10% 10G/186mL (10,$RemoveBeforeDEI'000mg'qIaVMXDjWbZZpZwl$ /131mL) Soln Generic drug: Immune Globulin 10% Inject into the vein.   Vitamin D 50 MCG (2000 UT) tablet Take 2,000 Units by mouth daily.        All past medical history, surgical history, allergies, family history, immunizations andmedications were updated in the EMR today and reviewed under the history and medication portions of their EMR.     ROS Negative, with the exception of above mentioned in HPI   Objective:  Temp 98.2 F (36.8 C) (Oral)   Ht $R'5\' 8"'Dm$  (1.727 m)   Wt (!) 320 lb (145.2 kg)   SpO2 97%   BMI 48.66 kg/m  Body mass index is 48.66 kg/m. Physical Exam Vitals and nursing note reviewed.  Constitutional:      General: She is not in acute distress.    Appearance: Normal appearance. She is normal weight. She is not ill-appearing or toxic-appearing.  HENT:     Head: Normocephalic and atraumatic.  Eyes:     Extraocular Movements: Extraocular movements intact.     Conjunctiva/sclera: Conjunctivae normal.     Pupils: Pupils are equal, round, and reactive to light.  Cardiovascular:      Rate and Rhythm: Normal rate and regular rhythm.  Pulmonary:     Effort: Pulmonary effort is normal.     Breath sounds: Normal breath sounds.  Abdominal:  General: Bowel sounds are decreased.     Palpations: Abdomen is soft. There is splenomegaly. There is no shifting dullness or mass.     Tenderness: There is abdominal tenderness in the left upper quadrant. There is no right CVA tenderness, left CVA tenderness, guarding or rebound.  Skin:    General: Skin is warm and dry.     Coloration: Skin is not jaundiced or pale.     Findings: No rash.  Neurological:     Mental Status: She is alert and oriented to person, place, and time. Mental status is at baseline.  Psychiatric:        Mood and Affect: Mood normal.        Behavior: Behavior normal.        Thought Content: Thought content normal.        Judgment: Judgment normal.      No results found. No results found. No results found for this or any previous visit (from the past 24 hour(s)).  Assessment/Plan: Keandrea Tapley is a 31 y.o. female present for OV for  Immune thrombocytopenic purpura (HCC)/lupus/enlarged spleen/left upper quadrant pain/nausea VSS.  Exam challenging due to body habitus.  With her history of an enlarged spleen and her HPI, there is concerns for her spleen enlarging/inflamed. Strongly encouraged her to rest this weekend.  Avoid any activity of exertion or potential for abdominal trauma, to avoid splenic injury. - Lipase - CBC w/Diff - C-reactive protein - Sedimentation rate - Comp Met (CMET) - CT Abdomen Pelvis Wo Contrast; Future Patient will be called with results and further plan discussed at that time.  If pain worsens she is to be seen emergently over the weekend.  Reviewed expectations re: course of current medical issues. Discussed self-management of symptoms. Outlined signs and symptoms indicating need for more acute intervention. Patient verbalized understanding and all questions were  answered. Patient received an After-Visit Summary.    Orders Placed This Encounter  Procedures   CT Abdomen Pelvis Wo Contrast   Lipase   CBC w/Diff   C-reactive protein   Sedimentation rate   Comp Met (CMET)   No orders of the defined types were placed in this encounter.  Referral Orders  No referral(s) requested today     Note is dictated utilizing voice recognition software. Although note has been proof read prior to signing, occasional typographical errors still can be missed. If any questions arise, please do not hesitate to call for verification.   electronically signed by:  Howard Pouch, DO  [place]

## 2021-07-29 NOTE — Patient Instructions (Signed)
Enlarged Spleen  An enlarged spleen (splenomegaly) is when the spleen is larger than normal. The spleen is an organ that is located in the upper left area of the abdomen, just under the ribs. The spleen is like a storage unit for red blood cells, and it also works to filter and clean the blood. It destroys cells that are damaged or worn out. The spleen is also important for fighting disease. This condition is usually noticed when the spleen is almost twice its normal size. An enlarged spleen is usually a sign of another health problem. What are the causes? This condition may be caused by: Mononucleosis and other viral infections. Infection with certain bacteria or parasites. Liver failure and other liver diseases. Blood diseases, such as hemolytic anemia. Blood cancers, such as leukemia or Hodgkin's disease. Other causes include: Tumors and fluid-filled sacs (cysts). Metabolic disorders, such as Gaucher disease or Niemann-Pick disease. Pressure or blood clots in the veins of the spleen. Connective tissue disorders, such as lupus or rheumatoid arthritis. What are the signs or symptoms? Symptoms of this condition include: Pain in the upper left part of the abdomen. The pain may spread to the left shoulder or get worse when you take a breath. Feeling full without eating or after eating only a small amount. Feeling tired. Chronic infections. Bleeding or bruising easily. In some cases, there are no symptoms. How is this diagnosed? This condition is diagnosed based on: A physical exam. Your health care provider will feel the left upper part of your abdomen. Tests, such as: Blood tests to check red and white blood cells and other proteins and enzymes. A biopsy. During a biopsy, a tissue sample of the liver or bone marrow may be removed and looked at under a microscope. A biopsy may be done if there is concern that the liver or bone marrow is causing the enlarged spleen. An abdominal  ultrasound. A CT scan. An MRI. How is this treated? Treatment for this condition depends on the cause. Treatment aims to: Manage the conditions that cause enlargement of the spleen. Reduce the size of the spleen. Treatment may include: Medicines to treat infection or disease. Radiation therapy. Blood transfusions. If these treatments do not help or if the cause cannot be found, surgery to remove the spleen (splenectomy) may be recommended. After surgery, you may need to have vaccinations or take antibiotics to prevent infections. Follow these instructions at home: Medicines Take over-the-counter and prescription medicines only as told by your health care provider. If you were prescribed an antibiotic medicine, take it as told by your health care provider. Do not stop taking the antibiotic even if you start to feel better. Talk with your health care provider about whether you need vaccinations to help prevent infections. This may be needed if treatment included surgery to remove the spleen. Not having a spleen makes certain infections more dangerous because it weakens the body's disease-fighting system (immune system). General instructions Follow instructions from your health care provider about limiting your activities. To avoid injury or a ruptured spleen, make sure you: Avoid contact sports. Wear a seat belt in the car. Keep all follow-up visits as told by your health care provider. This is important. Contact a health care provider if: Your symptoms do not improve as expected. You have a fever or chills. You feel generally ill. You have increased pain when you take in a breath. Get help right away if you: Experience an injury or impact to the spleen area. Have  abdominal pain that becomes severe. Feel dizzy or you faint. Feel very weak. Have cold and clammy skin. Have sweating for no reason. Have chest pain or difficulty breathing. Summary An enlarged spleen (splenomegaly) is  when the spleen is larger than normal. An enlarged spleen is usually a sign of another health problem. This condition is treated with medicines, radiation therapy, blood transfusions, vaccines, or surgery. This information is not intended to replace advice given to you by your health care provider. Make sure you discuss any questions you have with your health care provider. Document Revised: 10/20/2020 Document Reviewed: 10/20/2020 Elsevier Patient Education  [place].

## 2021-07-30 LAB — C-REACTIVE PROTEIN: CRP: 14.3 mg/L — ABNORMAL HIGH (ref <8.0)

## 2021-07-30 LAB — COMPREHENSIVE METABOLIC PANEL
AG Ratio: 1.1 (calc) (ref 1.0–2.5)
ALT: 12 U/L (ref 6–29)
AST: 16 U/L (ref 10–30)
Albumin: 3.9 g/dL (ref 3.6–5.1)
Alkaline phosphatase (APISO): 58 U/L (ref 31–125)
BUN: 13 mg/dL (ref 7–25)
CO2: 20 mmol/L (ref 20–32)
Calcium: 9.2 mg/dL (ref 8.6–10.2)
Chloride: 106 mmol/L (ref 98–110)
Creat: 0.92 mg/dL (ref 0.50–0.96)
Globulin: 3.7 g/dL (calc) (ref 1.9–3.7)
Glucose, Bld: 70 mg/dL (ref 65–99)
Potassium: 3.9 mmol/L (ref 3.5–5.3)
Sodium: 137 mmol/L (ref 135–146)
Total Bilirubin: 0.3 mg/dL (ref 0.2–1.2)
Total Protein: 7.6 g/dL (ref 6.1–8.1)

## 2021-07-30 LAB — CBC WITH DIFFERENTIAL/PLATELET
Absolute Monocytes: 586 cells/uL (ref 200–950)
Basophils Absolute: 31 cells/uL (ref 0–200)
Basophils Relative: 0.5 %
Eosinophils Absolute: 61 cells/uL (ref 15–500)
Eosinophils Relative: 1 %
HCT: 39.1 % (ref 35.0–45.0)
Hemoglobin: 13.4 g/dL (ref 11.7–15.5)
Lymphs Abs: 1592 cells/uL (ref 850–3900)
MCH: 28.5 pg (ref 27.0–33.0)
MCHC: 34.3 g/dL (ref 32.0–36.0)
MCV: 83 fL (ref 80.0–100.0)
MPV: 10 fL (ref 7.5–12.5)
Monocytes Relative: 9.6 %
Neutro Abs: 3831 cells/uL (ref 1500–7800)
Neutrophils Relative %: 62.8 %
Platelets: 253 10*3/uL (ref 140–400)
RBC: 4.71 10*6/uL (ref 3.80–5.10)
RDW: 13.5 % (ref 11.0–15.0)
Total Lymphocyte: 26.1 %
WBC: 6.1 10*3/uL (ref 3.8–10.8)

## 2021-07-30 LAB — LIPASE: Lipase: 39 U/L (ref 7–60)

## 2021-07-30 LAB — SEDIMENTATION RATE: Sed Rate: 29 mm/h — ABNORMAL HIGH (ref 0–20)

## 2021-08-01 ENCOUNTER — Telehealth: Payer: Self-pay | Admitting: Family Medicine

## 2021-08-01 NOTE — Telephone Encounter (Signed)
Please inform patient Her inflammatory markers are elevated Her blood cell counts are normal, no anemia or infection noted. Her pancreatic enzyme is normal. Liver and kidney function, as well as electrolyte levels are all normal.  Definitely appears to be an inflammatory reaction causing her symptoms. I had ordered a CT abdomen and pelvis on Friday, can we please get this scheduled ASAP for her.

## 2021-08-01 NOTE — Telephone Encounter (Signed)
Spoke with pt regarding labs and instructions.   

## 2021-08-01 NOTE — Telephone Encounter (Signed)
Please advise on status of ct.

## 2021-08-02 NOTE — Telephone Encounter (Signed)
Noted,

## 2021-08-03 ENCOUNTER — Ambulatory Visit (INDEPENDENT_AMBULATORY_CARE_PROVIDER_SITE_OTHER): Payer: BC Managed Care – PPO

## 2021-08-03 DIAGNOSIS — R112 Nausea with vomiting, unspecified: Secondary | ICD-10-CM

## 2021-08-03 DIAGNOSIS — R1012 Left upper quadrant pain: Secondary | ICD-10-CM

## 2021-08-03 DIAGNOSIS — R109 Unspecified abdominal pain: Secondary | ICD-10-CM | POA: Diagnosis not present

## 2021-08-04 ENCOUNTER — Telehealth: Payer: Self-pay | Admitting: Family Medicine

## 2021-08-04 NOTE — Telephone Encounter (Signed)
Please call patient: Her CT abdomen is reassuring.  Her spleen size is normal. There is no acute process of the abdomen or pelvis that would be creating abdominal discomfort.  There was note of a lymph node that was present, but it has been present in the past and is stable. "There is an enlarged gastrosplenic lymph node measuring 12 mm short axis which appears unchanged."

## 2021-08-04 NOTE — Telephone Encounter (Signed)
Spoke with pt regarding labs and instructions.   

## 2021-08-25 DIAGNOSIS — D839 Common variable immunodeficiency, unspecified: Secondary | ICD-10-CM | POA: Diagnosis not present

## 2021-08-26 DIAGNOSIS — D839 Common variable immunodeficiency, unspecified: Secondary | ICD-10-CM | POA: Diagnosis not present

## 2021-08-26 LAB — COMPREHENSIVE METABOLIC PANEL
ALT: 15 IU/L (ref 0–32)
AST: 26 IU/L (ref 0–40)
Albumin/Globulin Ratio: 1.8 (ref 1.2–2.2)
Albumin: 4.6 g/dL (ref 4.0–5.0)
Alkaline Phosphatase: 84 IU/L (ref 44–121)
BUN/Creatinine Ratio: 10 (ref 9–23)
BUN: 9 mg/dL (ref 6–20)
Bilirubin Total: 0.2 mg/dL (ref 0.0–1.2)
CO2: 21 mmol/L (ref 20–29)
Calcium: 9.7 mg/dL (ref 8.7–10.2)
Chloride: 99 mmol/L (ref 96–106)
Creatinine, Ser: 0.93 mg/dL (ref 0.57–1.00)
Globulin, Total: 2.5 g/dL (ref 1.5–4.5)
Glucose: 88 mg/dL (ref 70–99)
Potassium: 4 mmol/L (ref 3.5–5.2)
Sodium: 139 mmol/L (ref 134–144)
Total Protein: 7.1 g/dL (ref 6.0–8.5)
eGFR: 85 mL/min/{1.73_m2} (ref >59)

## 2021-08-26 LAB — CBC WITH DIFFERENTIAL/PLATELET
Basophils Absolute: 0 10*3/uL (ref 0.0–0.2)
Basos: 1 %
EOS (ABSOLUTE): 0.1 10*3/uL (ref 0.0–0.4)
Eos: 1 %
Hematocrit: 40.5 % (ref 34.0–46.6)
Hemoglobin: 13.6 g/dL (ref 11.1–15.9)
Immature Grans (Abs): 0 10*3/uL (ref 0.0–0.1)
Immature Granulocytes: 0 %
Lymphocytes Absolute: 2.1 10*3/uL (ref 0.7–3.1)
Lymphs: 25 %
MCH: 27.4 pg (ref 26.6–33.0)
MCHC: 33.6 g/dL (ref 31.5–35.7)
MCV: 82 fL (ref 79–97)
Monocytes Absolute: 0.8 10*3/uL (ref 0.1–0.9)
Monocytes: 9 %
Neutrophils Absolute: 5.2 10*3/uL (ref 1.4–7.0)
Neutrophils: 64 %
Platelets: 258 10*3/uL (ref 150–450)
RBC: 4.96 x10E6/uL (ref 3.77–5.28)
RDW: 14.2 % (ref 11.7–15.4)
WBC: 8.1 10*3/uL (ref 3.4–10.8)

## 2021-08-26 LAB — C-REACTIVE PROTEIN: CRP: 15 mg/L — ABNORMAL HIGH (ref 0–10)

## 2021-08-26 LAB — IGG, IGA, IGM
IgA/Immunoglobulin A, Serum: 25 mg/dL — ABNORMAL LOW (ref 87–352)
IgG (Immunoglobin G), Serum: 1173 mg/dL (ref 586–1602)
IgM (Immunoglobulin M), Srm: 19 mg/dL — ABNORMAL LOW (ref 26–217)

## 2021-08-26 LAB — SEDIMENTATION RATE: Sed Rate: 7 mm/hr (ref 0–32)

## 2021-08-30 ENCOUNTER — Encounter: Payer: Self-pay | Admitting: Allergy

## 2021-09-12 NOTE — Progress Notes (Unsigned)
Follow Up Note  RE: Shirley Carr MRN: [id number] DOB: 12-Sep-1990 Date of Office Visit: 09/13/2021  Referring provider: Ma Hillock, DO Primary care provider: Ma Hillock, DO  Chief Complaint: follow up  History of Present Illness: I had the pleasure of seeing Shirley Carr for a follow up visit at the [facility] on 09/13/2021. She is a 31 y.o. female, who is being followed for CVID on IVIG, nonallergic rhinitis and lupus. Her previous allergy office visit was on 05/10/2021 with Dr. Maudie Mercury. Today is a regular follow up visit.  CVID  Currently on IVIG every 4 weeks and prefers to get infusions at the center. No issues with the infusions.   No infections/antibiotics use since the last visit. Denies any bruising/bleeding or lymphadenography.   She noted some issues with labile blood sugars. She has been checking at home and had a few episodes where it dropped below 60 and felt lightheaded and nauseous. Sometimes it goes up to 160s.   Patient had CT chest for next week and has appointment with pulmonology later this month.    Lupus ([place]) On plaquenil - managed by rheumatology. Stable.   Assessment and Plan: Shirley Carr is a 31 y.o. female with: CVID (common variable immunodeficiency) ([place]) Past history - Diagnosed with CVID in 2020 and has been on Hizentra 13gm weekly (6 injection sites). Initially had some fatigue and headaches. Last injection on 06/22/2019. Pretreatment levels on 07/09/2018 IgG 459, IgA <40, IgM 28. History of frequent sinusitis, 1 pneumonia, ear infections, meningitis, foot infection requiring hospitalization. 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 History of ITP in 20211, diagnosed with lupus and on Plaquenil. No history of blood clots. Covid-19 in 2022. Hyquvia caused localized irritation. Interim history - on IVIG since March 2023 at infusion center with no issues. No infections/antibiotics.  Continue IV infusions every 4  weeks. Get bloodwork every 4 months - due in December.  Get before you are due for your infusion.  Keep track of infections. Keep appointment for the repeat CT chest and pulmonology.  Get spirometry once per year.   Lupus ([place]) On plaquenil - managed by rheumatology. Stable.  Labile blood glucose Noted labile sugars from below 60 to above 160s. Initially concerned if related to IVIG - unlikely as it's happening not right after infusions.  Follow up with PCP regarding this.   Return in about 4 months (around 01/13/2022).  No orders of the defined types were placed in this encounter.  Lab Orders         CBC with Differential/Platelet         Comprehensive metabolic panel         Sedimentation rate         IgG, IgA, IgM      Diagnostics: None.   Medication List:  Current Outpatient Medications  Medication Sig Dispense Refill   DULoxetine (CYMBALTA) 20 MG capsule Take 2 capsules (40 mg total) by mouth daily. 180 capsule 1   hydroxychloroquine (PLAQUENIL) 200 MG tablet Take 400 mg by mouth daily.     Immune Globulin 10% 10G/178m (10,'000mg'$ /1068m SOLN Inject into the vein.     No current facility-administered medications for this visit.   Allergies: Allergies  Allergen Reactions   Pseudoephedrine Other (See Comments)    Joint swelling   I reviewed her past medical history, social history, family history, and environmental history and no significant changes have been reported from her previous visit.  Review of Systems  Constitutional:  Negative for appetite change, chills, fever and unexpected weight change.  HENT:  Negative for congestion, nosebleeds, postnasal drip and rhinorrhea.   Eyes:  Negative for itching.  Respiratory:  Negative for cough, chest tightness, shortness of breath and wheezing.   Cardiovascular:  Negative for chest pain.  Gastrointestinal:  Negative for abdominal pain.  Genitourinary:  Negative for difficulty urinating.  Skin:  Negative for rash.   Allergic/Immunologic: Negative for environmental allergies and food allergies.  Neurological:  Negative for headaches.    Objective: BP 118/78 (BP Location: Left Arm, Patient Position: Sitting, Cuff Size: Large)   Pulse 88   Resp 18   SpO2 99%  There is no height or weight on file to calculate BMI. Physical Exam Vitals and nursing note reviewed.  Constitutional:      Appearance: Normal appearance. She is well-developed. She is obese.  HENT:     Head: Normocephalic and atraumatic.     Right Ear: Tympanic membrane and external ear normal.     Left Ear: Tympanic membrane and external ear normal.     Nose: Nose normal.     Mouth/Throat:     Mouth: Mucous membranes are moist.     Pharynx: Oropharynx is clear.  Eyes:     Conjunctiva/sclera: Conjunctivae normal.  Cardiovascular:     Rate and Rhythm: Normal rate and regular rhythm.     Heart sounds: Normal heart sounds. No murmur heard.    No friction rub. No gallop.  Pulmonary:     Effort: Pulmonary effort is normal.     Breath sounds: Normal breath sounds. No wheezing, rhonchi or rales.  Abdominal:     Palpations: Abdomen is soft.  Musculoskeletal:     Cervical back: Neck supple.  Skin:    General: Skin is warm.     Findings: No bruising or rash.  Neurological:     Mental Status: She is alert and oriented to person, place, and time.  Psychiatric:        Mood and Affect: Mood normal.        Behavior: Behavior normal.    Previous notes and tests were reviewed. The plan was reviewed with the patient/family, and all questions/concerned were addressed.  It was my pleasure to see Shirley Carr today and participate in her care. Please feel free to contact me with any questions or concerns.  Sincerely,  Rexene Alberts, DO Allergy & Immunology  [facility] office: [place] office: [phone]

## 2021-09-13 ENCOUNTER — Encounter: Payer: Self-pay | Admitting: Allergy

## 2021-09-13 ENCOUNTER — Ambulatory Visit (INDEPENDENT_AMBULATORY_CARE_PROVIDER_SITE_OTHER): Payer: BC Managed Care – PPO | Admitting: Allergy

## 2021-09-13 VITALS — BP 118/78 | HR 88 | Resp 18

## 2021-09-13 DIAGNOSIS — M329 Systemic lupus erythematosus, unspecified: Secondary | ICD-10-CM | POA: Diagnosis not present

## 2021-09-13 DIAGNOSIS — D839 Common variable immunodeficiency, unspecified: Secondary | ICD-10-CM

## 2021-09-13 DIAGNOSIS — J310 Chronic rhinitis: Secondary | ICD-10-CM

## 2021-09-13 DIAGNOSIS — R7309 Other abnormal glucose: Secondary | ICD-10-CM | POA: Diagnosis not present

## 2021-09-13 NOTE — Assessment & Plan Note (Addendum)
Noted labile sugars from below 60 to above 160s. Initially concerned if related to IVIG - unlikely as it's happening not right after infusions.   Follow up with PCP regarding this.

## 2021-09-13 NOTE — Assessment & Plan Note (Signed)
Past history - Diagnosed with CVID in 2020 and has been on Hizentra 13gm weekly (6 injection sites). Initially had some fatigue and headaches. Last injection on 06/22/2019. Pretreatment levels on 07/09/2018 IgG 459, IgA <40, IgM 28. History of frequent sinusitis, 1 pneumonia, ear infections, meningitis, foot infection requiring hospitalization. 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 History of ITP in 20211, diagnosed with lupus and on Plaquenil. No history of blood clots. Covid-19 in 2022. Hyquvia caused localized irritation. Interim history - on IVIG since March 2023 at infusion center with no issues. No infections/antibiotics.   Continue IV infusions every 4 weeks.  Get bloodwork every 4 months - due in December.   Get before you are due for your infusion.   Keep track of infections.  Keep appointment for the repeat CT chest and pulmonology.   Get spirometry once per year.

## 2021-09-13 NOTE — Assessment & Plan Note (Signed)
   On plaquenil - managed by rheumatology. Stable.

## 2021-09-13 NOTE — Patient Instructions (Addendum)
CVID Continue IV infusions every 4 weeks. Get bloodwork every 4 months - due in December.  Get before you are due for your infusion.  Keep track of infections. Keep appointment for the repeat CT chest and pulmonology.   Rhinitis: May use over the counter antihistamines such as Zyrtec (cetirizine), Claritin (loratadine), Allegra (fexofenadine), or Xyzal (levocetirizine) daily as needed. May use azelastine nasal spray 1-2 sprays per nostril twice a day as needed for runny nose/drainage.  Follow up in 4 months or sooner if needed.  Follow up with PCP regarding your sugars.

## 2021-09-19 ENCOUNTER — Ambulatory Visit (HOSPITAL_COMMUNITY)
Admission: RE | Admit: 2021-09-19 | Discharge: 2021-09-19 | Disposition: A | Discharge status: Home / Self Care | Payer: BC Managed Care – PPO | Source: Ambulatory Visit | Attending: Internal Medicine | Admitting: Internal Medicine

## 2021-09-19 DIAGNOSIS — R590 Localized enlarged lymph nodes: Secondary | ICD-10-CM | POA: Insufficient documentation

## 2021-09-19 DIAGNOSIS — R591 Generalized enlarged lymph nodes: Secondary | ICD-10-CM | POA: Diagnosis not present

## 2021-09-23 DIAGNOSIS — D839 Common variable immunodeficiency, unspecified: Secondary | ICD-10-CM | POA: Diagnosis not present

## 2021-09-26 ENCOUNTER — Encounter: Payer: Self-pay | Admitting: Internal Medicine

## 2021-09-26 ENCOUNTER — Ambulatory Visit (INDEPENDENT_AMBULATORY_CARE_PROVIDER_SITE_OTHER): Payer: BC Managed Care – PPO | Admitting: Internal Medicine

## 2021-09-26 VITALS — BP 132/76 | HR 95 | Temp 98.1°F | Ht 67.0 in | Wt 326.4 lb

## 2021-09-26 DIAGNOSIS — G4733 Obstructive sleep apnea (adult) (pediatric): Secondary | ICD-10-CM | POA: Diagnosis not present

## 2021-09-26 DIAGNOSIS — R599 Enlarged lymph nodes, unspecified: Secondary | ICD-10-CM

## 2021-09-26 NOTE — Progress Notes (Signed)
Shirley Carr    [id number]    [birth year]  Primary Care Physician:Kuneff, Reinaldo Raddle, DO Date of Appointment: 09/26/2021 Established Patient Visit  Chief complaint:   Chief Complaint  Patient presents with   Follow-up    CT f/u      HPI: Shirley Carr is a 31 y.o.an wom with history of SLE on plaquenil and CVID on IVIG infusion who is seeing me for enlarged axillary adenopathy.   Interval Updates: Here for followup after CT scan. No symptoms, no recent illness or infection.  She is having excessive daytime sleepiness. She notes snoring and apneas which cause her to wake up in the middle of the night.  Feeling tired at work, works with children. Denies unscheduled naps during the day.   I have reviewed the patient's family social and past medical history and updated as appropriate.   Past Medical History:  Diagnosis Date   Allergy    Anxiety    Arthritis    Blood in stool    Chicken pox    Chronic ITP (idiopathic thrombocytopenia) (H[facility])    Colon polyps 02/28/2018   Adenoma-repeat colonoscopy 2 years   COVID-19 07/29/2020   CVID (common variable immunodeficiency) (H[facility])    GERD (gastroesophageal reflux disease)    H/O stem cell transplant ([place]) 2014   Hiatal hernia 02/28/2018   Small hiatal hernia on endoscopy, gastritis.  Biopsies negative for H. 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 pylori and celiac   GANGLION  CYST EXCISION Right 2007   Right wrist   HARDWARE REMOVAL Right 2020   Right ankle plate and screws removal   ORIF ANKLE FRACTURE Right 2019   Plates and screws placed (removed in 2020)    Family History  Problem Relation Age of Onset   Urticaria Mother    Asthma Mother    Depression Mother    Hypertension Mother    Miscarriages / [place] Mother    Colon polyps Mother    Eczema Brother    Arthritis Brother    Alcohol abuse Father    Heart attack Father    Allergic rhinitis Neg Hx    Colon cancer Neg Hx    Esophageal cancer Neg Hx    Stomach cancer Neg Hx     Social History   Occupational History   Not on file  Tobacco Use   Smoking status: Never   Smokeless tobacco: Never  Vaping Use   Vaping Use: Never used  Substance and Sexual Activity   Alcohol use: Yes    Comment: occasionaly   Drug use: Never   Sexual activity: Yes    Partners: Male     Physical Exam: Blood pressure 132/76, pulse 95, temperature 98.1 F (36.7 C), temperature source Oral, height $RemoveBefo'5\' 7"'hnoezOGNaIF$  (1.702 m), weight (!) 326 lb 6.4 oz (148.1 kg), last menstrual period 09/19/2021, SpO2  97 %.  Gen:      No acute distress ENT:  mallampati 3, no nasal polyps, mucus membranes moist Lungs:    No increased respiratory effort, symmetric chest wall excursion, clear to auscultation bilaterally, no wheezes or crackles CV:         Regular rate and rhythm; no murmurs, rubs, or gallops.  No pedal edema   Data Reviewed: Imaging: I have personally reviewed the CT Chest 09/2021 - single axillary lymph node unchanged at 41mm. No other changes.  PFTs:  Labs:  Immunization status: Immunization History  Administered Date(s) Administered   HPV Quadrivalent 07/23/2006, 10/04/2006   Hepatitis B, PED/ADOLESCENT 21-Jan-1990, 01/14/1992, 06/11/1992   Hpv-Unspecified 07/23/2006, 10/04/2006, 01/31/2007   IPV 04/13/1992, 06/11/1992, 06/15/1993, 02/19/1996   Influenza,inj,quad, With Preservative 10/04/2015   MMR  03/14/1993, 12/11/1996   Meningococcal Conjugate 07/23/2006, 07/23/2006   Moderna Sars-Covid-2 Vaccination 01/09/2020, 02/06/2020   Tdap 02/13/1992, 04/13/1992, 06/11/1992, 06/15/1993, 02/19/1996, 06/23/2006, 07/23/2006, 01/03/2016, 08/29/2016, 09/09/2016    External Records Personally Reviewed:   Assessment:  High concern for OSA Axillary lymphadenopathy - likely benign.  Plan/Recommendations: Will proceed with HSAT.  No further follow up needed for her single mildly enlarged 75mm axillary lymph node. Discussed when to be concerned for enlarging lymphadenopathy or change in constitutional symptoms.    Return to Care: Return in about 2 months (around 11/26/2021).   Lenice Llamas, MD [facility] [place]

## 2021-09-26 NOTE — Patient Instructions (Addendum)
Please schedule follow up scheduled with APP in approximately 2 months (after home sleep test results).   Please call [phone] if you haven't heard from [place].  Before your next visit I would like you to have: Home sleep test - we will call you to schedule this.   No further follow up needed for this lymph node - it hasn't changed in almost a year

## 2021-10-04 ENCOUNTER — Ambulatory Visit (INDEPENDENT_AMBULATORY_CARE_PROVIDER_SITE_OTHER): Payer: BC Managed Care – PPO | Admitting: Family Medicine

## 2021-10-04 ENCOUNTER — Encounter: Payer: Self-pay | Admitting: Family Medicine

## 2021-10-04 VITALS — BP 114/83 | HR 91 | Temp 98.1°F | Ht 67.0 in | Wt 315.8 lb

## 2021-10-04 DIAGNOSIS — F410 Panic disorder [episodic paroxysmal anxiety] without agoraphobia: Secondary | ICD-10-CM | POA: Diagnosis not present

## 2021-10-04 DIAGNOSIS — F419 Anxiety disorder, unspecified: Secondary | ICD-10-CM

## 2021-10-04 MED ORDER — DULOXETINE HCL 20 MG PO CPEP: 40.0000 mg | ORAL_CAPSULE | Freq: Every day | ORAL | 1 refills | Status: DC

## 2021-10-04 NOTE — Patient Instructions (Addendum)
Return for cpe already scheduled for feb. Doristine Devoid to see you today.  I have refilled the medication(s) we provide.   If labs were collected, we will inform you of lab results once received either by echart message or telephone call.   - echart message- for normal results that have been seen by the patient already.   - telephone call: abnormal results or if patient has not viewed results in their echart.

## 2021-10-04 NOTE — Progress Notes (Signed)
    Patient ID: Shirley Carr, female  DOB: 12/08/1990, 30 y.o.   MRN: [id number] Patient Care Team    Relationship Specialty Notifications Start End  Kuneff, Renee A, DO PCP - General Family Medicine  02/18/20   Aryal, Govinda, MD Consulting Physician Rheumatology  02/18/20   Kim, Yoon M, DO Consulting Physician Allergy  02/18/20   Adkins, Gretchen, MD Consulting Physician Obstetrics and Gynecology  02/18/20   Weaver, R Grey, MD Referring Physician Ophthalmology  02/18/20   Armbruster, Steven P, MD Consulting Physician Gastroenterology  02/18/20   Desai, Nikita S, MD Consulting Physician Pulmonary Disease  12/08/20     Chief Complaint  Patient presents with   Anxiety    Subjective: Shirley Carr is a 30 y.o. female present for anxiety follow up All past medical history, surgical history, allergies, family history, immunizations, medications and social history were updated in the electronic medical record today. All recent labs, ED visits and hospitalizations within the last year were reviewed.  Anxiety: pt reports  Cymbalta 40 mg qd is working well for her. Cymbalta 60 mg had a blunting side effect.  Prior note: Pt presents for follow-up on her new onset anxiety and panic after starting Cymbalta 30 mg 4 weeks ago.   She reports she definitely has noticed a difference since starting Cymbalta and she feels her panic has improved.  She does not wake up panicked any longer.  She still is having significant anxiety when driving, especially if behind large trucks or if the weather is bad.      04/22/2021    8:16 AM 12/08/2020    9:25 AM 11/04/2020   10:02 AM 02/18/2020   10:39 AM  Depression screen PHQ 2/9  Decreased Interest 1 1 1 0  Down, Depressed, Hopeless 1 1 1 0  PHQ - 2 Score 2 2 2 0  Altered sleeping 2 1 2   Tired, decreased energy 3 2 3   Change in appetite 1 1 3   Feeling bad or failure about yourself  1 1 2   Trouble concentrating 3 1 1   Moving slowly or fidgety/restless 1 0  0   Suicidal thoughts 0 0 0   PHQ-9 Score 13 8 13       04/22/2021    8:16 AM 12/08/2020    9:27 AM 11/04/2020   10:02 AM  GAD 7 : Generalized Anxiety Score  Nervous, Anxious, on Edge 0 2 3  Control/stop worrying 0 2 3  Worry too much - different things 0 1 3  Trouble relaxing 0 1 2  Restless 0 0 1  Easily annoyed or irritable 1 2 2  Afraid - awful might happen 0 1 3  Total GAD 7 Score 1 9 17          02/18/2020   12:58 PM  Fall Risk   Falls in the past year? 0  Number falls in past yr: 0  Injury with Fall? 0  Follow up Falls evaluation completed   Immunization History  Administered Date(s) Administered   HPV Quadrivalent 07/23/2006, 10/04/2006   Hepatitis B, PED/ADOLESCENT 10/22/1991, 01/14/1992, 06/11/1992   Hpv-Unspecified 07/23/2006, 10/04/2006, 01/31/2007   IPV 04/13/1992, 06/11/1992, 06/15/1993, 02/19/1996   Influenza,inj,quad, With Preservative 10/04/2015   MMR 03/14/1993, 12/11/1996   Meningococcal Conjugate 07/23/2006, 07/23/2006   Moderna Sars-Covid-2 Vaccination 01/09/2020, 02/06/2020   Tdap 02/13/1992, 04/13/1992, 06/11/1992, 06/15/1993, 02/19/1996, 06/23/2006, 07/23/2006, 01/03/2016, 08/29/2016, 09/09/2016   Past Medical History:  Diagnosis Date     Allergy    Anxiety    Arthritis    Blood in stool    Chicken pox    Chronic ITP (idiopathic thrombocytopenia) ([facility])    Colon polyps 02/28/2018   Adenoma-repeat colonoscopy 2 years   COVID-19 07/29/2020   CVID (common variable immunodeficiency) ([facility])    GERD (gastroesophageal reflux disease)    H/O stem cell transplant ([facility]) 2014   Hiatal hernia 02/28/2018   Small hiatal hernia on endoscopy, gastritis.  Biopsies negative for H. pylori and celiac   History of asthma 06/26/2019   Lupus ([facility])    Lupus encephalitis ([facility]) 2013   Hospitalized   Osteomyelitis ([facility]) 2018   Hospitalized foot/ankle   Polyp of colon 02/27/2018   Raynaud's disease    Recurrent upper respiratory infection ([facility])    UTI (urinary  tract infection)    Allergies  Allergen Reactions   Pseudoephedrine Other (See Comments)    Joint swelling   Past Surgical History:  Procedure Laterality Date   COLONOSCOPY  02/28/2018   Adenoma-repeat colonoscopy 2 years   CYST EXCISION Right 2020   Benign cyst excision right arm x2   ESOPHAGOGASTRODUODENOSCOPY  02/28/2018   Small hiatal hernia, mild gastritis, biopsies negative for H. pylori and celiac   GANGLION CYST EXCISION Right 2007   Right wrist   HARDWARE REMOVAL Right 2020   Right ankle plate and screws removal   ORIF ANKLE FRACTURE Right 2019   Plates and screws placed (removed in 2020)   Family History  Problem Relation Age of Onset   Urticaria Mother    Asthma Mother    Depression Mother    Hypertension Mother    Miscarriages / Stillbirths Mother    Colon polyps Mother    Eczema Brother    Arthritis Brother    Alcohol abuse Father    Heart attack Father    Allergic rhinitis Neg Hx    Colon cancer Neg Hx    Esophageal cancer Neg Hx    Stomach cancer Neg Hx    Social History   Social History Narrative   Marital status/children/pets: single   Education/employment: masters degree. Employed as a mortgage loan closer   Safety:      -smoke alarm in the home:No     - wears seatbelt: No     - Feels safe in their relationships: No    Allergies as of 10/04/2021       Reactions   Pseudoephedrine Other (See Comments)   Joint swelling        Medication List        Accurate as of October 04, 2021  8:23 AM. If you have any questions, ask your nurse or doctor.          DULoxetine 20 MG capsule Commonly known as: CYMBALTA Take 2 capsules (40 mg total) by mouth daily.   hydroxychloroquine 200 MG tablet Commonly known as: PLAQUENIL Take 400 mg by mouth daily.   Immune Globulin 10% 10G/100mL (10,000mg/100mL) Soln Generic drug: Immune Globulin 10% Inject into the vein.       All past medical history, surgical history, allergies, family history,  immunizations andmedications were updated in the EMR today and reviewed under the history and medication portions of their EMR.      ROS 14 pt review of systems performed and negative (unless mentioned in an HPI) Objective: BP 114/83   Pulse 91   Temp 98.1 F (36.7 C)   Ht 5' 7" (1.702 m)     Wt (!) 315 lb 12.8 oz (143.2 kg)   LMP 09/19/2021   SpO2 98%   BMI 49.46 kg/m  Physical Exam Vitals and nursing note reviewed.  Constitutional:      General: She is not in acute distress.    Appearance: Normal appearance. She is obese. She is not ill-appearing or toxic-appearing.  HENT:     Head: Normocephalic and atraumatic.  Eyes:     General: No scleral icterus.       Right eye: No discharge.        Left eye: No discharge.     Extraocular Movements: Extraocular movements intact.     Conjunctiva/sclera: Conjunctivae normal.     Pupils: Pupils are equal, round, and reactive to light.  Cardiovascular:     Rate and Rhythm: Normal rate.  Neurological:     General: No focal deficit present.     Mental Status: She is alert and oriented to person, place, and time. Mental status is at baseline.     Cranial Nerves: No cranial nerve deficit.     Sensory: No sensory deficit.     Motor: No weakness.     Coordination: Coordination normal.     Gait: Gait normal.     Deep Tendon Reflexes: Reflexes normal.  Psychiatric:        Mood and Affect: Mood normal.        Behavior: Behavior normal.        Thought Content: Thought content normal.        Judgment: Judgment normal.     No results found.  Assessment/plan: Shirley Carr is a 30 y.o. female present for .  Anxiety/panic -- stable - continue Cymbalta 40 mg today (20mg cap x2) - f/u with cpe/cmc in feb.    Return for cpe already scheduled for feb. .  No orders of the defined types were placed in this encounter.  No orders of the defined types were placed in this encounter.  Meds ordered this encounter  Medications   DULoxetine  (CYMBALTA) 20 MG capsule    Sig: Take 2 capsules (40 mg total) by mouth daily.    Dispense:  180 capsule    Refill:  1   Referral Orders  No referral(s) requested today     Note is dictated utilizing voice recognition software. Although note has been proof read prior to signing, occasional typographical errors still can be missed. If any questions arise, please do not hesitate to call for verification.  Electronically signed by: Renee Kuneff, DO [facility]  

## 2021-10-13 ENCOUNTER — Encounter: Payer: Self-pay | Admitting: Family Medicine

## 2021-10-21 DIAGNOSIS — D839 Common variable immunodeficiency, unspecified: Secondary | ICD-10-CM | POA: Diagnosis not present

## 2021-11-10 ENCOUNTER — Encounter: Payer: Self-pay | Admitting: Allergy

## 2021-11-16 DIAGNOSIS — Z3009 Encounter for other general counseling and advice on contraception: Secondary | ICD-10-CM | POA: Diagnosis not present

## 2021-11-16 DIAGNOSIS — N926 Irregular menstruation, unspecified: Secondary | ICD-10-CM | POA: Diagnosis not present

## 2021-11-16 NOTE — Telephone Encounter (Signed)
Filled out form again and gave to MA to fax.  Requested a copy to be put in my inbox just in case.

## 2021-11-17 ENCOUNTER — Telehealth: Payer: Self-pay

## 2021-11-17 NOTE — Telephone Encounter (Signed)
Noted.  It was also faxed on 11/16/2021.

## 2021-11-17 NOTE — Telephone Encounter (Signed)
Patient plans to pick up forms at the [facility] office on 11/18/21. A copy was made and placed in bulk scanning. The paper work has been placed in an envelope and is ready for pickup.

## 2021-11-18 DIAGNOSIS — D839 Common variable immunodeficiency, unspecified: Secondary | ICD-10-CM | POA: Diagnosis not present

## 2021-11-29 ENCOUNTER — Ambulatory Visit: Payer: BC Managed Care – PPO | Admitting: Nurse Practitioner

## 2021-11-29 DIAGNOSIS — Z79899 Other long term (current) drug therapy: Secondary | ICD-10-CM | POA: Diagnosis not present

## 2021-12-12 DIAGNOSIS — L578 Other skin changes due to chronic exposure to nonionizing radiation: Secondary | ICD-10-CM | POA: Diagnosis not present

## 2021-12-12 DIAGNOSIS — L814 Other melanin hyperpigmentation: Secondary | ICD-10-CM | POA: Diagnosis not present

## 2021-12-12 DIAGNOSIS — C4401 Basal cell carcinoma of skin of lip: Secondary | ICD-10-CM | POA: Diagnosis not present

## 2021-12-12 DIAGNOSIS — D225 Melanocytic nevi of trunk: Secondary | ICD-10-CM | POA: Diagnosis not present

## 2021-12-13 DIAGNOSIS — Z79899 Other long term (current) drug therapy: Secondary | ICD-10-CM | POA: Diagnosis not present

## 2021-12-13 DIAGNOSIS — M329 Systemic lupus erythematosus, unspecified: Secondary | ICD-10-CM | POA: Diagnosis not present

## 2021-12-13 DIAGNOSIS — Z862 Personal history of diseases of the blood and blood-forming organs and certain disorders involving the immune mechanism: Secondary | ICD-10-CM | POA: Diagnosis not present

## 2021-12-13 DIAGNOSIS — D839 Common variable immunodeficiency, unspecified: Secondary | ICD-10-CM | POA: Diagnosis not present

## 2021-12-15 DIAGNOSIS — D839 Common variable immunodeficiency, unspecified: Secondary | ICD-10-CM | POA: Diagnosis not present

## 2021-12-16 DIAGNOSIS — D839 Common variable immunodeficiency, unspecified: Secondary | ICD-10-CM | POA: Diagnosis not present

## 2021-12-16 LAB — CBC WITH DIFFERENTIAL/PLATELET
Basophils Absolute: 0 10*3/uL (ref 0.0–0.2)
Basos: 0 %
EOS (ABSOLUTE): 0.1 10*3/uL (ref 0.0–0.4)
Eos: 1 %
Hematocrit: 43.7 % (ref 34.0–46.6)
Hemoglobin: 14.3 g/dL (ref 11.1–15.9)
Immature Grans (Abs): 0 10*3/uL (ref 0.0–0.1)
Immature Granulocytes: 0 %
Lymphocytes Absolute: 2.2 10*3/uL (ref 0.7–3.1)
Lymphs: 25 %
MCH: 27.6 pg (ref 26.6–33.0)
MCHC: 32.7 g/dL (ref 31.5–35.7)
MCV: 84 fL (ref 79–97)
Monocytes Absolute: 0.7 10*3/uL (ref 0.1–0.9)
Monocytes: 8 %
Neutrophils Absolute: 5.8 10*3/uL (ref 1.4–7.0)
Neutrophils: 66 %
Platelets: 308 10*3/uL (ref 150–450)
RBC: 5.19 x10E6/uL (ref 3.77–5.28)
RDW: 13 % (ref 11.7–15.4)
WBC: 8.8 10*3/uL (ref 3.4–10.8)

## 2021-12-16 LAB — COMPREHENSIVE METABOLIC PANEL
ALT: 15 IU/L (ref 0–32)
AST: 21 IU/L (ref 0–40)
Albumin/Globulin Ratio: 1.7 (ref 1.2–2.2)
Albumin: 4.5 g/dL (ref 4.0–5.0)
Alkaline Phosphatase: 99 IU/L (ref 44–121)
BUN/Creatinine Ratio: 9 (ref 9–23)
BUN: 8 mg/dL (ref 6–20)
Bilirubin Total: 0.2 mg/dL (ref 0.0–1.2)
CO2: 25 mmol/L (ref 20–29)
Calcium: 9.8 mg/dL (ref 8.7–10.2)
Chloride: 100 mmol/L (ref 96–106)
Creatinine, Ser: 0.88 mg/dL (ref 0.57–1.00)
Globulin, Total: 2.6 g/dL (ref 1.5–4.5)
Glucose: 82 mg/dL (ref 70–99)
Potassium: 4.4 mmol/L (ref 3.5–5.2)
Sodium: 141 mmol/L (ref 134–144)
Total Protein: 7.1 g/dL (ref 6.0–8.5)
eGFR: 91 mL/min/{1.73_m2} (ref >59)

## 2021-12-16 LAB — IGG, IGA, IGM
IgA/Immunoglobulin A, Serum: 26 mg/dL — ABNORMAL LOW (ref 87–352)
IgG (Immunoglobin G), Serum: 1088 mg/dL (ref 586–1602)
IgM (Immunoglobulin M), Srm: 22 mg/dL — ABNORMAL LOW (ref 26–217)

## 2021-12-16 LAB — SEDIMENTATION RATE: Sed Rate: 8 mm/hr (ref 0–32)

## 2021-12-20 ENCOUNTER — Encounter: Payer: Self-pay | Admitting: Emergency Medicine

## 2021-12-20 ENCOUNTER — Ambulatory Visit: Admit: 2021-12-20 | Payer: BC Managed Care – PPO | Source: Home / Self Care

## 2021-12-20 ENCOUNTER — Ambulatory Visit
Admission: EM | Admit: 2021-12-20 | Discharge: 2021-12-20 | Disposition: A | Discharge status: Home / Self Care | Payer: BC Managed Care – PPO | Attending: Family Medicine | Admitting: Family Medicine

## 2021-12-20 DIAGNOSIS — K831 Obstruction of bile duct: Secondary | ICD-10-CM

## 2021-12-20 DIAGNOSIS — M329 Systemic lupus erythematosus, unspecified: Secondary | ICD-10-CM | POA: Diagnosis not present

## 2021-12-20 DIAGNOSIS — R040 Epistaxis: Secondary | ICD-10-CM | POA: Diagnosis not present

## 2021-12-20 DIAGNOSIS — J3489 Other specified disorders of nose and nasal sinuses: Secondary | ICD-10-CM

## 2021-12-20 DIAGNOSIS — IMO0002 Reserved for concepts with insufficient information to code with codable children: Secondary | ICD-10-CM

## 2021-12-20 DIAGNOSIS — D839 Common variable immunodeficiency, unspecified: Secondary | ICD-10-CM

## 2021-12-20 NOTE — Discharge Instructions (Signed)
Lots of fluids Use the Flonase as directed See your doctor if improving by next week

## 2021-12-20 NOTE — ED Triage Notes (Signed)
Sinus drainage  Blood  tinge when blowing her nose the last 2 days  Denies fever or chills

## 2021-12-20 NOTE — ED Provider Notes (Signed)
[facility]    CSN: [id number] Arrival date & time: 12/20/21  1039      History   Chief Complaint Chief Complaint  Patient presents with   Facial Pain    HPI Shirley Carr is a 30 y.o. female.   HPI  Patient states that she has had some sinus drainage, blood streaks of blood when she blows her nose.  No fever or chills.  No headache or body ache.  No recent illness.  She does have underlying allergies.  She is immune compromised.  She states that when she has purulent sputum and epistaxis she usually takes antibiotics.  She sees allergies and receives immunoglobulin every couple of weeks.  Past Medical History:  Diagnosis Date   Allergy    Anxiety    Arthritis    Blood in stool    Chicken pox    Chronic ITP (idiopathic thrombocytopenia) (HCC)    Colon polyps 02/28/2018   Adenoma-repeat colonoscopy 2 years   COVID-19 07/29/2020   CVID (common variable immunodeficiency) (HCC)    GERD (gastroesophageal reflux disease)    H/O stem cell transplant ([place]) 2014   Hiatal hernia 02/28/2018   Small hiatal hernia on endoscopy, gastritis.  Biopsies negative for H. 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   Hospitalized   Osteomyelitis ([place]) 2018   Hospitalized foot/ankle   Polyp of colon 02/27/2018   Raynaud's disease    Recurrent upper respiratory infection (URI)    UTI (urinary tract infection)     Patient Active Problem List   Diagnosis Date Noted   Labile blood glucose 09/13/2021   Spleen enlarged 07/29/2021   BMI 45.0-49.9, adult ([place]) 02/18/2021   Panic attack 11/04/2020   Anxiety 11/04/2020   Hemoglobinuria 02/20/2020   Arthritis 02/17/2020   Immune thrombocytopenic purpura ([place]) 02/17/2020   Irritable bowel syndrome 02/17/2020   Lupus ([place]) 02/17/2020   Morbid obesity ([place]) 02/17/2020   Raynaud phenomenon 02/17/2020   CVID (common variable immunodeficiency) ([place]) 06/26/2019    Past  Surgical History:  Procedure Laterality Date   COLONOSCOPY  02/28/2018   Adenoma-repeat colonoscopy 2 years   CYST EXCISION Right 2020   Benign cyst excision right arm x2   ESOPHAGOGASTRODUODENOSCOPY  02/28/2018   Small hiatal hernia, mild gastritis, biopsies negative for H. pylori and celiac   GANGLION CYST EXCISION Right 2007   Right wrist   HARDWARE REMOVAL Right 2020   Right ankle plate and screws removal   ORIF ANKLE FRACTURE Right 2019   Plates and screws placed (removed in 2020)    OB History     Gravida  0   Para  0   Term  0   Preterm  0   AB  0   Living  0      SAB  0   IAB  0   Ectopic  0   Multiple  0   Live Births  0            Home Medications    Prior to Admission medications   Medication Sig Start Date End Date Taking? Authorizing Provider  JENCYCLA 0.35 MG tablet Take 1 tablet by mouth daily. 12/06/21  Yes [provider]  DULoxetine (CYMBALTA) 20 MG capsule Take 2 capsules (40 mg total) by mouth daily. 10/04/21   Kuneff, Renee A, DO  hydroxychloroquine (PLAQUENIL) 200 MG tablet Take 400 mg by mouth daily.  [provider]  Immune Globulin 10% 10G/171m (10,'000mg'$ /1073m SOLN Inject into the vein.    [provider]    Family History Family History  Problem Relation Age of Onset   Urticaria Mother    Asthma Mother    Depression Mother    Hypertension Mother    Miscarriages / StKoreaother    Colon polyps Mother    Eczema Brother    Arthritis Brother    Alcohol abuse Father    Heart attack Father    Allergic rhinitis Neg Hx    Colon cancer Neg Hx    Esophageal cancer Neg Hx    Stomach cancer Neg Hx     Social History Social History   Tobacco Use   Smoking status: Never   Smokeless tobacco: Never  Vaping Use   Vaping Use: Never used  Substance Use Topics   Alcohol use: Yes    Comment: occasionaly   Drug use: Never     Allergies   Pseudoephedrine   Review of Systems Review of  Systems See HPI  Physical Exam Triage Vital Signs ED Triage Vitals  Enc Vitals Group     BP 12/20/21 1204 126/85     Pulse Rate 12/20/21 1204 86     Resp 12/20/21 1204 16     Temp 12/20/21 1204 99.4 F (37.4 C)     Temp Source 12/20/21 1204 Oral     SpO2 12/20/21 1204 100 %     Weight 12/20/21 1206 (!) 315 lb (142.9 kg)     Height 12/20/21 1206 '5\' 7"'$  (1.702 m)     Head Circumference --      Peak Flow --      Pain Score 12/20/21 1206 0     Pain Loc --      Pain Edu? --      Excl. in [place]--    No data found.  Updated Vital Signs BP 126/85 (BP Location: Left Arm)   Pulse 86   Temp 99.4 F (37.4 C) (Oral)   Resp 16   Ht '5\' 7"'$  (1.702 m)   Wt (!) 142.9 kg   LMP 12/11/2021 (Exact Date)   SpO2 100%   BMI 49.34 kg/m      Physical Exam Constitutional:      General: She is not in acute distress.    Appearance: She is well-developed. She is obese. She is ill-appearing.  HENT:     Head: Normocephalic and atraumatic.     Right Ear: Tympanic membrane and external ear normal.     Left Ear: Tympanic membrane and external ear normal.     Nose: Congestion and rhinorrhea present.     Comments: Mucus and blood crusted in both nostrils.  Sinuses are tender    Mouth/Throat:     Pharynx: Posterior oropharyngeal erythema present.  Eyes:     Conjunctiva/sclera: Conjunctivae normal.     Pupils: Pupils are equal, round, and reactive to light.  Cardiovascular:     Rate and Rhythm: Normal rate.     Heart sounds: Normal heart sounds. No murmur heard. Pulmonary:     Effort: Pulmonary effort is normal. No respiratory distress.     Breath sounds: Normal breath sounds.  Abdominal:     General: There is no distension.     Palpations: Abdomen is soft.  Musculoskeletal:        General: Normal range of motion.     Cervical back: Normal range of motion.  Skin:  General: Skin is warm and dry.  Neurological:     Mental Status: She is alert.      UC Treatments / Results  Labs (all  labs ordered are listed, but only abnormal results are displayed) Labs Reviewed - No data to display  EKG   Radiology No results found.  Procedures Procedures (including critical care time)  Medications Ordered in UC Medications - No data to display  Initial Impression / Assessment and Plan / UC Course  I have reviewed the triage vital signs and the nursing notes.  Pertinent labs & imaging results that were available during my care of the patient were reviewed by me and considered in my medical decision making (see chart for details).     Discussed that patient likely has a viral illness. Final Clinical Impressions(s) / UC Diagnoses   Final diagnoses:  Epistaxis  Nasal drainage  Common bile duct (CBD) obstruction  Lupus ([place])  Common variable immunodeficiency ([place])     Discharge Instructions      Lots of fluids Use the Flonase as directed See your doctor if improving by next week     ED Prescriptions   None    PDMP not reviewed this encounter.   Raylene Everts, MD 12/20/21 [phone]

## 2022-01-11 NOTE — Progress Notes (Unsigned)
Follow Up Note  RE: Shirley Carr MRN: [id number] DOB: 03/23/1990 Date of Office Visit: 01/12/2022  Referring provider: Ma Hillock, DO Primary care provider: Ma Hillock, DO  Chief Complaint: cvid  History of Present Illness: I had the pleasure of seeing Shirley Carr for a follow up visit at the [facility] on 01/12/2022. She is a 32 y.o. female, who is being followed for C VID on IVIG, lupus. Her previous allergy office visit was on 09/13/2021 with Dr. Maudie Mercury. Today is a regular follow up visit.  CVID Currently does IVIG every 4 weeks with no issues and tolerating it well. No infections since the last visit. Works from home.  CT chest unchanged adenopathy.   Lupus ([place]) On plaquenil - managed by rheumatology. Stable.   Labile blood glucose Resolved.  Component     Latest Ref Rng 12/15/2021  IgG (Immunoglobin G), Serum     586 - 1,602 mg/dL 1,088   IgA/Immunoglobulin A, Serum     87 - 352 mg/dL 26 (L)   IgM (Immunoglobulin M), Srm     26 - 217 mg/dL 22 (L)    09/19/2021 CT chest: "IMPRESSION: Stable mildly enlarged bilateral axillary adenopathy is noted which most likely is reactive and benign in etiology given the lack of change.   No other definite abnormality is noted in the chest."  Assessment and Plan: Shirley Carr is a 32 y.o. female with: CVID (common variable immunodeficiency) ([place]) Past history - Diagnosed with CVID in 2020 and has been on Hizentra 13gm weekly (6 injection sites). Initially had some fatigue and headaches. Last injection on 06/22/2019. Pretreatment levels on 07/09/2018 IgG 459, IgA <40, IgM 28. History of frequent sinusitis, 1 pneumonia, ear infections, meningitis, foot infection requiring hospitalization. 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 History of ITP in 20211, diagnosed with lupus and on Plaquenil. No history of blood clots. Covid-19 in 2022. Hyquvia caused localized irritation. On IVIG since March 2023   Interim history - tolerating IVIG at infusion center with no issues. No infections/antibiotics. Repeat CT chest in 2023 showed stable axillary adenopathy b/l - most likely reactive and benign. Continue IV infusions every 4 weeks. Get bloodwork every 4 months - due in April Get before you are due for your infusion.  Keep track of infections. Normal spirometry today.  Chronic rhinitis Past history - PND symptoms in the mornings. Patient had skin prick testing and bloodwork in 2020 which was negative to environmental allergens. Interestingly though she had positive testing beforehand and was on immunotherapy for 2 short months.  Interim history - increased rhinorrhea. Flonase ineffective.  May use over the counter antihistamines such as Zyrtec (cetirizine), Claritin (loratadine), Allegra (fexofenadine), or Xyzal (levocetirizine) daily as needed. Use Atrovent (ipratropium) 0.03% 1-2 sprays per nostril twice a day as needed for runny nose/drainage. Flonase is good for nasal congestion.  Return in about 6 months (around 07/13/2022).  Meds ordered this encounter  Medications   ipratropium (ATROVENT) 0.03 % nasal spray    Sig: Place 1-2 sprays into both nostrils 2 (two) times daily as needed (nasal drainage).    Dispense:  30 mL    Refill:  5   Lab Orders         Allergens w/Total IgE Area 2         CBC with Differential/Platelet         IgG, IgA, IgM         Sedimentation rate  Comprehensive metabolic panel      Diagnostics: Spirometry:  Tracings reviewed. Her effort: Good reproducible efforts. FVC: 4.53L FEV1: 3.67L, 102% predicted FEV1/FVC ratio: 81% Interpretation: Spirometry consistent with normal pattern.  Please see scanned spirometry results for details.  Medication List:  Current Outpatient Medications  Medication Sig Dispense Refill   DULoxetine (CYMBALTA) 20 MG capsule Take 2 capsules (40 mg total) by mouth daily. 180 capsule 1   fluticasone (FLONASE) 50 MCG/ACT  nasal spray Place 2 sprays into both nostrils daily.     hydroxychloroquine (PLAQUENIL) 200 MG tablet Take 400 mg by mouth daily.     Immune Globulin 10% 10G/135m (10,'000mg'$ /106m SOLN Inject into the vein.     ipratropium (ATROVENT) 0.03 % nasal spray Place 1-2 sprays into both nostrils 2 (two) times daily as needed (nasal drainage). 30 mL 5   JENCYCLA 0.35 MG tablet Take 1 tablet by mouth daily.     No current facility-administered medications for this visit.   Allergies: Allergies  Allergen Reactions   Pseudoephedrine Other (See Comments)    Joint swelling   I reviewed her past medical history, social history, family history, and environmental history and no significant changes have been reported from her previous visit.  Review of Systems  Constitutional:  Negative for appetite change, chills, fever and unexpected weight change.  HENT:  Positive for rhinorrhea. Negative for congestion, nosebleeds and postnasal drip.   Eyes:  Negative for itching.  Respiratory:  Negative for cough, chest tightness, shortness of breath and wheezing.   Cardiovascular:  Negative for chest pain.  Gastrointestinal:  Negative for abdominal pain.  Genitourinary:  Negative for difficulty urinating.  Skin:  Negative for rash.  Allergic/Immunologic: Negative for environmental allergies and food allergies.  Neurological:  Negative for headaches.    Objective: BP 110/74   Pulse 98   Temp 98.1 F (36.7 C) (Temporal)   Resp 18   LMP 12/11/2021 (Exact Date)   SpO2 97%  There is no height or weight on file to calculate BMI. Physical Exam Vitals and nursing note reviewed.  Constitutional:      Appearance: Normal appearance. She is well-developed. She is obese.  HENT:     Head: Normocephalic and atraumatic.     Right Ear: Tympanic membrane and external ear normal.     Left Ear: Tympanic membrane and external ear normal.     Nose:     Comments: Specks of blood in nares noted.    Mouth/Throat:      Mouth: Mucous membranes are moist.     Pharynx: Oropharynx is clear.  Eyes:     Conjunctiva/sclera: Conjunctivae normal.  Cardiovascular:     Rate and Rhythm: Normal rate and regular rhythm.     Heart sounds: Normal heart sounds. No murmur heard.    No friction rub. No gallop.  Pulmonary:     Effort: Pulmonary effort is normal.     Breath sounds: Normal breath sounds. No wheezing, rhonchi or rales.  Abdominal:     Palpations: Abdomen is soft.  Musculoskeletal:     Cervical back: Neck supple.  Skin:    General: Skin is warm.     Findings: No bruising or rash.  Neurological:     Mental Status: She is alert and oriented to person, place, and time.  Psychiatric:        Mood and Affect: Mood normal.        Behavior: Behavior normal.    Previous notes and tests were reviewed.  The plan was reviewed with the patient/family, and all questions/concerned were addressed.  It was my pleasure to see Shirley Carr today and participate in her care. Please feel free to contact me with any questions or concerns.  Sincerely,  Rexene Alberts, DO Allergy & Immunology  [facility] office: [place] office: [phone]

## 2022-01-12 ENCOUNTER — Ambulatory Visit (INDEPENDENT_AMBULATORY_CARE_PROVIDER_SITE_OTHER): Payer: BC Managed Care – PPO | Admitting: Allergy

## 2022-01-12 ENCOUNTER — Encounter: Payer: Self-pay | Admitting: Allergy

## 2022-01-12 VITALS — BP 110/74 | HR 98 | Temp 98.1°F | Resp 18

## 2022-01-12 DIAGNOSIS — D839 Common variable immunodeficiency, unspecified: Secondary | ICD-10-CM

## 2022-01-12 DIAGNOSIS — J310 Chronic rhinitis: Secondary | ICD-10-CM

## 2022-01-12 DIAGNOSIS — M329 Systemic lupus erythematosus, unspecified: Secondary | ICD-10-CM | POA: Diagnosis not present

## 2022-01-12 MED ORDER — IPRATROPIUM BROMIDE 0.03 % NA SOLN: 1.0000 | Freq: Two times a day (BID) | NASAL | 5 refills | Status: DC | PRN

## 2022-01-12 NOTE — Assessment & Plan Note (Signed)
Past history - PND symptoms in the mornings. Patient had skin prick testing and bloodwork in 2020 which was negative to environmental allergens. Interestingly though she had positive testing beforehand and was on immunotherapy for 2 short months.  Interim history - increased rhinorrhea. Flonase ineffective.  May use over the counter antihistamines such as Zyrtec (cetirizine), Claritin (loratadine), Allegra (fexofenadine), or Xyzal (levocetirizine) daily as needed. Use Atrovent (ipratropium) 0.03% 1-2 sprays per nostril twice a day as needed for runny nose/drainage. Flonase is good for nasal congestion.

## 2022-01-12 NOTE — Assessment & Plan Note (Signed)
Past history - Diagnosed with CVID in 2020 and has been on Hizentra 13gm weekly (6 injection sites). Initially had some fatigue and headaches. Last injection on 06/22/2019. Pretreatment levels on 07/09/2018 IgG 459, IgA <40, IgM 28. History of frequent sinusitis, 1 pneumonia, ear infections, meningitis, foot infection requiring hospitalization. 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 History of ITP in 20211, diagnosed with lupus and on Plaquenil. No history of blood clots. Covid-19 in 2022. Hyquvia caused localized irritation. On IVIG since March 2023  Interim history - tolerating IVIG at infusion center with no issues. No infections/antibiotics. Repeat CT chest in 2023 showed stable axillary adenopathy b/l - most likely reactive and benign. Continue IV infusions every 4 weeks. Get bloodwork every 4 months - due in April Get before you are due for your infusion.  Keep track of infections. Normal spirometry today.

## 2022-01-12 NOTE — Patient Instructions (Addendum)
CVID Continue IV infusions every 4 weeks. Get bloodwork every 4 months - due in April Get before you are due for your infusion.  Keep track of infections. Normal breathing test at home.   Rhinitis: May use over the counter antihistamines such as Zyrtec (cetirizine), Claritin (loratadine), Allegra (fexofenadine), or Xyzal (levocetirizine) daily as needed. Use Atrovent (ipratropium) 0.03% 1-2 sprays per nostril twice a day as needed for runny nose/drainage. Flonase is good for nasal congestion.  Nose Bleeds: Nosebleeds are very common.  Site of the bleeding is typically on the septum or at the very front of the nose.  Some of the more common causes are from trauma, inflammation or medication induced. Pinch both nostrils while leaning forward for at least 5 minutes before checking to see if the bleeding has stopped. If bleeding is not controlled within 5-10 minutes apply a cotton ball soaked with oxymetazoline (Afrin) to the bleeding nostril for a few seconds.  Preventative treatment: Apply saline nasal gel in each nostril twice a day for 2 weeks to allow the nasal mucosa to heal Consider using a humidifier in the winter Try to keep your blood pressure as normal as possible (120/80)  Follow up in 6 months or sooner if needed.

## 2022-01-13 DIAGNOSIS — D839 Common variable immunodeficiency, unspecified: Secondary | ICD-10-CM | POA: Diagnosis not present

## 2022-01-20 ENCOUNTER — Encounter: Payer: Self-pay | Admitting: Internal Medicine

## 2022-02-10 DIAGNOSIS — D839 Common variable immunodeficiency, unspecified: Secondary | ICD-10-CM | POA: Diagnosis not present

## 2022-02-20 ENCOUNTER — Encounter: Payer: BC Managed Care – PPO | Admitting: Family Medicine

## 2022-02-20 ENCOUNTER — Ambulatory Visit (INDEPENDENT_AMBULATORY_CARE_PROVIDER_SITE_OTHER): Payer: BC Managed Care – PPO | Admitting: Family Medicine

## 2022-02-20 ENCOUNTER — Encounter: Payer: Self-pay | Admitting: Family Medicine

## 2022-02-20 VITALS — BP 138/87 | HR 85 | Temp 98.0°F | Ht 68.5 in | Wt 323.6 lb

## 2022-02-20 DIAGNOSIS — M329 Systemic lupus erythematosus, unspecified: Secondary | ICD-10-CM

## 2022-02-20 DIAGNOSIS — Z131 Encounter for screening for diabetes mellitus: Secondary | ICD-10-CM | POA: Diagnosis not present

## 2022-02-20 DIAGNOSIS — M069 Rheumatoid arthritis, unspecified: Secondary | ICD-10-CM | POA: Diagnosis not present

## 2022-02-20 DIAGNOSIS — F419 Anxiety disorder, unspecified: Secondary | ICD-10-CM | POA: Diagnosis not present

## 2022-02-20 DIAGNOSIS — Z0000 Encounter for general adult medical examination without abnormal findings: Secondary | ICD-10-CM

## 2022-02-20 DIAGNOSIS — Z13220 Encounter for screening for lipoid disorders: Secondary | ICD-10-CM | POA: Diagnosis not present

## 2022-02-20 DIAGNOSIS — F410 Panic disorder [episodic paroxysmal anxiety] without agoraphobia: Secondary | ICD-10-CM

## 2022-02-20 DIAGNOSIS — D839 Common variable immunodeficiency, unspecified: Secondary | ICD-10-CM

## 2022-02-20 MED ORDER — DULOXETINE HCL 20 MG PO CPEP: 40.0000 mg | ORAL_CAPSULE | Freq: Every day | ORAL | 1 refills | Status: DC

## 2022-02-20 NOTE — Progress Notes (Signed)
Patient ID: Shirley Carr, female  DOB: [birth year], 32 y.o.   MRN: [id number] Patient Care Team    Relationship Specialty Notifications Start End  Ma Hillock, DO PCP - General Family Medicine  02/18/20   Lahoma Rocker, MD Consulting Physician Rheumatology  02/18/20   Garnet Sierras, DO Consulting Physician Allergy  02/18/20   Marylynn Pearson, MD Consulting Physician Obstetrics and Gynecology  02/18/20   Konrad Felix, MD Referring Physician Ophthalmology  02/18/20   Armbruster, Carlota Raspberry, MD Consulting Physician Gastroenterology  02/18/20   Spero Geralds, MD Consulting Physician Pulmonary Disease  12/08/20     Chief Complaint  Patient presents with   Annual Exam    Subjective:  Shirley Carr is a 32 y.o. female present for CPE and Chronic Conditions/illness Management All past medical history, surgical history, allergies, family history, immunizations, medications and social history were updated in the electronic medical record today. All recent labs, ED visits and hospitalizations within the last year were reviewed.  Health maintenance:  Colonoscopy: 04/06/2020, Dr. Havery Moros.  5-year follow-up. Mammogram: no fhx.  Cervical cancer screening: last pap: 02/2020,completed by:Dr. Julien Girt. schedule next week. Immunizations: tdap UTD 2018,Influenza declined (encouraged yearly) Infectious disease screening: HIV and  Hep C screen completed DEXA:per rheumatology recs Patient has a Dental home. Hospitalizations/ED visits: Reviewed  Anxiety: Patient reports compliance with Cymbalta 40 mg daily.  She states medication is working well for her today. Prior note: Pt presents for follow-up on her new onset anxiety and panic after starting Cymbalta 30 mg 4 weeks ago.   She reports she definitely has noticed a difference since starting Cymbalta and she feels her panic has improved.  She does not wake up panicked any longer.  She still is having significant anxiety when driving, especially if behind  large trucks or if the weather is bad.    04/22/2021    8:16 AM 12/08/2020    9:25 AM 11/04/2020   10:02 AM 02/18/2020   10:39 AM  Depression screen PHQ 2/9  Decreased Interest 1 1 1 $ 0  Down, Depressed, Hopeless 1 1 1 $ 0  PHQ - 2 Score 2 2 2 $ 0  Altered sleeping 2 1 2   $ Tired, decreased energy 3 2 3   $ Change in appetite 1 1 3   $ Feeling bad or failure about yourself  1 1 2   $ Trouble concentrating 3 1 1   $ Moving slowly or fidgety/restless 1 0 0   Suicidal thoughts 0 0 0   PHQ-9 Score 13 8 13       $ 04/22/2021    8:16 AM 12/08/2020    9:27 AM 11/04/2020   10:02 AM  GAD 7 : Generalized Anxiety Score  Nervous, Anxious, on Edge 0 2 3  Control/stop worrying 0 2 3  Worry too much - different things 0 1 3  Trouble relaxing 0 1 2  Restless 0 0 1  Easily annoyed or irritable 1 2 2  $ Afraid - awful might happen 0 1 3  Total GAD 7 Score 1 9 17          $ 02/18/2020   12:58 PM  Fall Risk   Falls in the past year? 0  Number falls in past yr: 0  Injury with Fall? 0  Follow up Falls evaluation completed   Immunization History  Administered Date(s) Administered   HPV Quadrivalent 07/23/2006, 10/04/2006   Hepatitis B, PED/ADOLESCENT 1990-10-21, 01/14/1992, 06/11/1992   Hpv-Unspecified 07/23/2006, 10/04/2006, 01/31/2007  IPV 04/13/1992, 06/11/1992, 06/15/1993, 02/19/1996   Influenza,inj,quad, With Preservative 10/04/2015   MMR 03/14/1993, 12/11/1996   Meningococcal Conjugate 07/23/2006, 07/23/2006   Moderna Sars-Covid-2 Vaccination 01/09/2020, 02/06/2020   Tdap 02/13/1992, 04/13/1992, 06/11/1992, 06/15/1993, 02/19/1996, 06/23/2006, 07/23/2006, 01/03/2016, 08/29/2016, 09/09/2016   Past Medical History:  Diagnosis Date   Allergy    Anxiety    Arthritis    Blood in stool    Chicken pox    Chronic ITP (idiopathic thrombocytopenia) ([place])    Colon polyps 02/28/2018   [place]denoma-repeat colonoscopy 2 years   COVID-19 07/29/2020   CVID (common variable immunodeficiency) ([place])    GERD  (gastroesophageal reflux disease)    H/O stem cell transplant ([place]) 2014   Hiatal hernia 02/28/2018   Small hiatal hernia on endoscopy, gastritis.  Biopsies negative for H. 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 pylori and celiac   GANGLION CYST EXCISION Right 2007   Right wrist   HARDWARE REMOVAL Right 2020   Right ankle plate and screws removal   ORIF ANKLE FRACTURE Right 2019   Plates and screws placed (removed in 2020)   Family History  Problem Relation Age of Onset   Urticaria Mother    Asthma Mother    Depression Mother    Hypertension Mother    Miscarriages / [place] Mother    Colon polyps Mother    Eczema Brother    Arthritis Brother    Alcohol abuse Father    Heart attack Father    Allergic rhinitis Neg Hx    Colon cancer Neg Hx    Esophageal cancer Neg Hx    Stomach cancer Neg Hx    Social History   Social History Narrative   Marital status/children/pets: single   Education/employment: masters degree. Employed as a Product manager:      -smoke alarm in the home:No     - wears seatbelt: No     - Feels safe in their relationships: No    Allergies as of 02/20/2022       Reactions   Pseudoephedrine Other (See Comments)   Joint swelling        Medication List        Accurate as of February 20, 2022  1:29  PM. If you have any questions, ask your nurse or doctor.          STOP taking these medications    fluticasone 50 MCG/ACT nasal spray Commonly known as: FLONASE Stopped by: Howard Pouch, DO       TAKE these medications    DULoxetine 20 MG capsule Commonly known as: CYMBALTA Take 2 capsules (40 mg total) by mouth daily.   hydroxychloroquine 200 MG tablet Commonly known as: PLAQUENIL Take 400 mg by mouth daily.   Immune Globulin 10% 10G/160m (10,0012m100mL) Soln  Generic drug: Immune Globulin 10% Inject into the vein.   ipratropium 0.03 % nasal spray Commonly known as: ATROVENT Place 1-2 sprays into both nostrils 2 (two) times daily as needed (nasal drainage).   Jencycla 0.35 MG tablet Generic drug: norethindrone Take 1 tablet by mouth daily.       All past medical history, surgical history, allergies, family history, immunizations andmedications were updated in the EMR today and reviewed under the history and medication portions of their EMR.       ROS 14 pt review of systems performed and negative (unless mentioned in an HPI)  Objective: BP 138/87   Pulse 85   Temp 98 F (36.7 C)   Ht 5' 8.5" (1.74 m)   Wt (!) 323 lb 9.6 oz (146.8 kg)   LMP 01/20/2022 (Approximate)   SpO2 100%   BMI 48.48 kg/m  Physical Exam Vitals and nursing note reviewed.  Constitutional:      General: She is not in acute distress.    Appearance: Normal appearance. She is not ill-appearing or toxic-appearing.  HENT:     Head: Normocephalic and atraumatic.     Right Ear: Tympanic membrane, ear canal and external ear normal. There is no impacted cerumen.     Left Ear: Tympanic membrane, ear canal and external ear normal. There is no impacted cerumen.     Nose: No congestion or rhinorrhea.     Mouth/Throat:     Mouth: Mucous membranes are moist.     Pharynx: Oropharynx is clear. No oropharyngeal exudate or posterior oropharyngeal erythema.  Eyes:     General: No scleral  icterus.       Right eye: No discharge.        Left eye: No discharge.     Extraocular Movements: Extraocular movements intact.     Conjunctiva/sclera: Conjunctivae normal.     Pupils: Pupils are equal, round, and reactive to light.  Cardiovascular:     Rate and Rhythm: Normal rate and regular rhythm.     Pulses: Normal pulses.     Heart sounds: Normal heart sounds. No murmur heard.    No friction rub. No gallop.  Pulmonary:     Effort: Pulmonary effort is normal. No respiratory distress.     Breath sounds: Normal breath sounds. No stridor. No wheezing, rhonchi or rales.  Chest:     Chest wall: No tenderness.  Abdominal:     General: Abdomen is flat. Bowel sounds are normal. There is no distension.     Palpations: Abdomen is soft. There is no mass.     Tenderness: There is no abdominal tenderness. There is no right CVA tenderness, left CVA tenderness, guarding or rebound.     Hernia: No hernia is present.  Musculoskeletal:        General: No swelling, tenderness or deformity. Normal range of motion.     Cervical back: Normal range of motion and neck supple. No rigidity or tenderness.     Right lower leg: No edema.     Left lower leg: No edema.  Lymphadenopathy:     Cervical: No cervical adenopathy.  Skin:    General: Skin is warm and dry.     Coloration: Skin is not jaundiced or pale.     Findings: No bruising, erythema, lesion or rash.  Neurological:     General: No focal deficit present.     Mental Status: She is alert and oriented to person, place, and time. Mental status is at  baseline.     Cranial Nerves: No cranial nerve deficit.     Sensory: No sensory deficit.     Motor: No weakness.     Coordination: Coordination normal.     Gait: Gait normal.     Deep Tendon Reflexes: Reflexes normal.  Psychiatric:        Mood and Affect: Mood normal.        Behavior: Behavior normal.        Thought Content: Thought content normal.        Judgment: Judgment normal.    No  results found.  Assessment/plan: Shirley Carr is a 32 y.o. female present for CPE and chronic conditions management Immune thrombocytopenic purpura ([place]) Reviewed recent CBC which is stable Anxiety/panic TSH collected today Continue Cymbalta 40 mg daily  Lupus (HCC)/CVID (common variable immunodeficiency) ([place]) Managed by rheum and allergy  Routine general medical examination at a health care facility Patient was encouraged to exercise greater than 150 minutes a week. Patient was encouraged to choose a diet filled with fresh fruits and vegetables, and lean meats. AVS provided to patient today for education/recommendation on gender specific health and safety maintenance. Colonoscopy: 04/06/2020, Dr. Havery Moros.  5-year follow-up. Mammogram: no fhx. Routine screen at 41 Cervical cancer screening: last pap: 02/2020,completed by:Dr. Julien Girt.  Immunizations: tdap UTD 2018,Influenza declined (encouraged yearly) Infectious disease screening: HIV and  Hep C screen completed DEXA:per rheumatology recs  Return in about 24 weeks (around 08/07/2022) for Routine chronic condition follow-up.   Orders Placed This Encounter  Procedures   Hemoglobin A1c   Lipid panel   TSH   Meds ordered this encounter  Medications   DULoxetine (CYMBALTA) 20 MG capsule    Sig: Take 2 capsules (40 mg total) by mouth daily.    Dispense:  180 capsule    Refill:  1   Referral Orders  No referral(s) requested today     Note is dictated utilizing voice recognition software. Although note has been proof read prior to signing, occasional typographical errors still can be missed. If any questions arise, please do not hesitate to call for verification.  Electronically signed by: Howard Pouch, DO [place]

## 2022-02-20 NOTE — Patient Instructions (Addendum)
Return in about 24 weeks (around 08/07/2022) for Routine chronic condition follow-up.        Great to see you today.  I have refilled the medication(s) we provide.   If labs were collected, we will inform you of lab results once received either by echart message or telephone call.   - echart message- for normal results that have been seen by the patient already.   - telephone call: abnormal results or if patient has not viewed results in their echart.

## 2022-02-21 ENCOUNTER — Telehealth: Payer: Self-pay | Admitting: Family Medicine

## 2022-02-21 DIAGNOSIS — E039 Hypothyroidism, unspecified: Secondary | ICD-10-CM | POA: Insufficient documentation

## 2022-02-21 LAB — LIPID PANEL
Cholesterol: 167 mg/dL (ref <200)
HDL: 45 mg/dL — ABNORMAL LOW (ref >=50)
LDL Cholesterol (Calc): 104 mg/dL (calc) — ABNORMAL HIGH
Non-HDL Cholesterol (Calc): 122 mg/dL (calc) (ref <130)
Total CHOL/HDL Ratio: 3.7 (calc) (ref <5.0)
Triglycerides: 87 mg/dL (ref <150)

## 2022-02-21 LAB — HEMOGLOBIN A1C
Hgb A1c MFr Bld: 5.3 % of total Hgb (ref <5.7)
Mean Plasma Glucose: 105 mg/dL
eAG (mmol/L): 5.8 mmol/L

## 2022-02-21 LAB — TSH: TSH: 4.87 mIU/L — ABNORMAL HIGH

## 2022-02-21 MED ORDER — LEVOTHYROXINE SODIUM 25 MCG PO TABS: 25.0000 ug | ORAL_TABLET | Freq: Every day | ORAL | 0 refills | Status: DC

## 2022-02-21 NOTE — Telephone Encounter (Signed)
Please inform patient Her cholesterol panel is at goal for her Her A1c/diabetes screening is normal Her thyroid is underactive.  It has been borderline over the last couple years, but this year is the first year he has been over the threshold of normal. However given her age normal goal for her age TSH is  around 2.5, and she is at 4.87 today.  I would recommend starting a low-dose thyroid supplement called levothyroxine 25 mcg daily to be taken daily on an empty stomach.  Follow-up with provider in 6 weeks and repeat labs will be completed at that time to see if tapering up on medication is required.  I sent that medication to her local pharmacy, once we get her on an established dose we we will then send to her mail-in pharmacy.

## 2022-02-21 NOTE — Telephone Encounter (Signed)
Spoke with patient regarding results/recommendations.  

## 2022-03-02 ENCOUNTER — Encounter: Payer: Self-pay | Admitting: Family Medicine

## 2022-03-02 ENCOUNTER — Emergency Department (HOSPITAL_COMMUNITY)
Admission: EM | Admit: 2022-03-02 | Discharge: 2022-03-02 | Disposition: A | Discharge status: Home / Self Care | Payer: BC Managed Care – PPO | Attending: Emergency Medicine | Admitting: Emergency Medicine

## 2022-03-02 ENCOUNTER — Other Ambulatory Visit: Payer: Self-pay

## 2022-03-02 ENCOUNTER — Emergency Department (HOSPITAL_COMMUNITY): Payer: BC Managed Care – PPO

## 2022-03-02 DIAGNOSIS — R519 Headache, unspecified: Secondary | ICD-10-CM | POA: Diagnosis not present

## 2022-03-02 DIAGNOSIS — Z01419 Encounter for gynecological examination (general) (routine) without abnormal findings: Secondary | ICD-10-CM | POA: Diagnosis not present

## 2022-03-02 DIAGNOSIS — Z6841 Body Mass Index (BMI) 40.0 and over, adult: Secondary | ICD-10-CM | POA: Diagnosis not present

## 2022-03-02 LAB — BASIC METABOLIC PANEL
Anion gap: 9 (ref 5–15)
BUN: 11 mg/dL (ref 6–20)
CO2: 24 mmol/L (ref 22–32)
Calcium: 9 mg/dL (ref 8.9–10.3)
Chloride: 102 mmol/L (ref 98–111)
Creatinine, Ser: 0.99 mg/dL (ref 0.44–1.00)
GFR, Estimated: >60 mL/min (ref >60)
Glucose, Bld: 76 mg/dL (ref 70–99)
Potassium: 3.7 mmol/L (ref 3.5–5.1)
Sodium: 135 mmol/L (ref 135–145)

## 2022-03-02 LAB — CBC WITH DIFFERENTIAL/PLATELET
Abs Immature Granulocytes: 0.03 10*3/uL (ref 0.00–0.07)
Basophils Absolute: 0.1 10*3/uL (ref 0.0–0.1)
Basophils Relative: 1 %
Eosinophils Absolute: 0.1 10*3/uL (ref 0.0–0.5)
Eosinophils Relative: 1 %
HCT: 42.2 % (ref 36.0–46.0)
Hemoglobin: 14 g/dL (ref 12.0–15.0)
Immature Granulocytes: 0 %
Lymphocytes Relative: 31 %
Lymphs Abs: 3.2 10*3/uL (ref 0.7–4.0)
MCH: 28.1 pg (ref 26.0–34.0)
MCHC: 33.2 g/dL (ref 30.0–36.0)
MCV: 84.6 fL (ref 80.0–100.0)
Monocytes Absolute: 0.8 10*3/uL (ref 0.1–1.0)
Monocytes Relative: 8 %
Neutro Abs: 6.1 10*3/uL (ref 1.7–7.7)
Neutrophils Relative %: 59 %
Platelets: 277 10*3/uL (ref 150–400)
RBC: 4.99 MIL/uL (ref 3.87–5.11)
RDW: 12.8 % (ref 11.5–15.5)
WBC: 10.2 10*3/uL (ref 4.0–10.5)
nRBC: 0 % (ref 0.0–0.2)

## 2022-03-02 MED ORDER — IOHEXOL 350 MG/ML SOLN
80.0000 mL | Freq: Once | INTRAVENOUS | Status: AC | PRN
  Administered 2022-03-02: 80 mL via INTRAVENOUS

## 2022-03-02 NOTE — ED Provider Notes (Signed)
Results for orders placed or performed during the hospital encounter of 03/02/22  CBC with Differential  Result Value Ref Range   WBC 10.2 4.0 - 10.5 K/uL   RBC 4.99 3.87 - 5.11 MIL/uL   Hemoglobin 14.0 12.0 - 15.0 g/dL   HCT 42.2 36.0 - 46.0 %   MCV 84.6 80.0 - 100.0 fL   MCH 28.1 26.0 - 34.0 pg   MCHC 33.2 30.0 - 36.0 g/dL   RDW 12.8 11.5 - 15.5 %   Platelets 277 150 - 400 K/uL   nRBC 0.0 0.0 - 0.2 %   Neutrophils Relative % 59 %   Neutro Abs 6.1 1.7 - 7.7 K/uL   Lymphocytes Relative 31 %   Lymphs Abs 3.2 0.7 - 4.0 K/uL   Monocytes Relative 8 %   Monocytes Absolute 0.8 0.1 - 1.0 K/uL   Eosinophils Relative 1 %   Eosinophils Absolute 0.1 0.0 - 0.5 K/uL   Basophils Relative 1 %   Basophils Absolute 0.1 0.0 - 0.1 K/uL   Immature Granulocytes 0 %   Abs Immature Granulocytes 0.03 0.00 - 0.07 K/uL  Basic metabolic panel  Result Value Ref Range   Sodium 135 135 - 145 mmol/L   Potassium 3.7 3.5 - 5.1 mmol/L   Chloride 102 98 - 111 mmol/L   CO2 24 22 - 32 mmol/L   Glucose, Bld 76 70 - 99 mg/dL   BUN 11 6 - 20 mg/dL   Creatinine, Ser 0.99 0.44 - 1.00 mg/dL   Calcium 9.0 8.9 - 10.3 mg/dL   GFR, Estimated >60 >60 mL/min   Anion gap 9 5 - 15   CT ANGIO HEAD NECK W WO CM  Result Date: 03/02/2022 CLINICAL DATA:  Initial evaluation for headache. EXAM: CT ANGIOGRAPHY HEAD AND NECK TECHNIQUE: Multidetector CT imaging of the head and neck was performed using the standard protocol during bolus administration of intravenous contrast. Multiplanar CT image reconstructions and MIPs were obtained to evaluate the vascular anatomy. Carotid stenosis measurements (when applicable) are obtained utilizing NASCET criteria, using the distal internal carotid diameter as the denominator. RADIATION DOSE REDUCTION: This exam was performed according to the departmental dose-optimization program which includes automated exposure control, adjustment of the mA and/or kV according to patient size and/or use of  iterative reconstruction technique. CONTRAST:  79m OMNIPAQUE IOHEXOL 350 MG/ML SOLN COMPARISON:  None Available. FINDINGS: CT HEAD FINDINGS Brain: Cerebral volume within normal limits for patient age. No evidence for acute intracranial hemorrhage. No findings to suggest acute large vessel territory infarct. No mass lesion, midline shift, or mass effect. Ventricles are normal in size without evidence for hydrocephalus. No extra-axial fluid collection identified. Vascular: No hyperdense vessel identified. Skull: Scalp soft tissues demonstrate no acute abnormality. Calvarium intact. Sinuses/Orbits: Globes and orbital soft tissues within normal limits. Visualized paranasal sinuses are clear. No mastoid effusion. CTA NECK FINDINGS Aortic arch: Standard branching. Imaged portion shows no evidence of aneurysm or dissection. No significant stenosis of the major arch vessel origins. Right carotid system: Right common and internal carotid arteries widely patent without stenosis, dissection or occlusion. Left carotid system: Left common and internal carotid arteries widely patent without stenosis, dissection or occlusion. Vertebral arteries: Both vertebral arteries arise from the subclavian arteries. No proximal subclavian artery stenosis. Both vertebral arteries widely patent without stenosis, dissection or occlusion. Skeleton: No discrete or worrisome osseous lesions. Other neck: No other acute soft tissue abnormality within the neck. Upper chest: Visualized upper chest demonstrates no acute finding.  Review of the MIP images confirms the above findings CTA HEAD FINDINGS Anterior circulation: Both internal carotid arteries widely patent to the termini without stenosis. A1 segments widely patent. Normal anterior communicating artery complex. Both anterior cerebral arteries widely patent to their distal aspects without stenosis. No M1 stenosis or occlusion. Normal MCA bifurcations. Distal MCA branches well perfused and  symmetric. Posterior circulation: Both V4 segments patent to the vertebrobasilar junction without stenosis. Both PICA origins patent and normal. Basilar widely patent to its distal aspect without stenosis. Superior cerebellar arteries patent bilaterally. Both PCAs primarily supplied via the basilar and are well perfused to there distal aspects. Venous sinuses: Patent allowing for timing of the contrast bolus. Anatomic variants: None significant. No aneurysm. Review of the MIP images confirms the above findings IMPRESSION: 1. Normal CTA of the head and neck. No large vessel occlusion, hemodynamically significant stenosis, or other acute vascular abnormality. No aneurysm. 2. No other acute intracranial abnormality. Electronically Signed   By: Jeannine Boga M.D.   On: 03/02/2022 23:27     CTA negative for acute findings.  Labs are reassuring.  Feel she is stable for discharge.  She had already messaged her doctor for follow-up, will touch base with office tomorrow about this again.  Given copies of labs/imaging studies for physician review if needed.  Can return here for new concerns.   Larene Pickett, PA-C 03/02/22 [id number]    Fatima Blank, MD 03/03/22 [phone]

## 2022-03-02 NOTE — ED Provider Notes (Signed)
[facility] Provider Note   CSN: [id number] Arrival date & time: 03/02/22  2004     History  Chief Complaint  Patient presents with   Headache    Shirley Carr is a 32 y.o. female with a past medical history of lupus presenting today with a headache.  She reports that a week ago she had 2 days of a severe headache.  It started to get better and she only had dull pain but then tonight she was watching videos and started laughing and had sharp pain in the same region.  Localizes it to the left temporal skull.  No blurred vision, slurred speech, weakness or numbness.  Does report that her headache seems to be worse anytime she bends over, laughs or has a bowel movement.  Reports that she started Synthroid around a week ago   Headache Associated symptoms: no dizziness, no numbness, no photophobia and no weakness        Home Medications Prior to Admission medications   Medication Sig Start Date End Date Taking? Authorizing Provider  DULoxetine (CYMBALTA) 20 MG capsule Take 2 capsules (40 mg total) by mouth daily. 02/20/22   Kuneff, Renee A, DO  hydroxychloroquine (PLAQUENIL) 200 MG tablet Take 400 mg by mouth daily.    [provider]  Immune Globulin 10% 10G/159m (10,'000mg'$ /1076m SOLN Inject into the vein.    [provider]  ipratropium (ATROVENT) 0.03 % nasal spray Place 1-2 sprays into both nostrils 2 (two) times daily as needed (nasal drainage). 01/12/22   KiGarnet SierrasDO  JENCYCLA 0.35 MG tablet Take 1 tablet by mouth daily. 12/06/21   [provider]  levothyroxine (SYNTHROID) 25 MCG tablet Take 1 tablet (25 mcg total) by mouth daily. 02/21/22   Kuneff, Renee A, DO      Allergies    Pseudoephedrine    Review of Systems   Review of Systems  Eyes:  Negative for photophobia and visual disturbance.  Neurological:  Positive for headaches. Negative for dizziness, syncope, facial asymmetry, speech  difficulty, weakness, light-headedness and numbness.    Physical Exam Updated Vital Signs BP (!) 176/120 (BP Location: Left Arm)   Pulse (!) 108   Temp 98.3 F (36.8 C) (Oral)   Resp 18   Ht '5\' 8"'$  (1.727 m)   Wt (!) 145.2 kg   LMP 01/20/2022 (Approximate)   SpO2 96%   BMI 48.66 kg/m  Physical Exam Vitals and nursing note reviewed.  Constitutional:      Appearance: Normal appearance.  HENT:     Head: Normocephalic and atraumatic.  Eyes:     General: No visual field deficit or scleral icterus.    Conjunctiva/sclera: Conjunctivae normal.  Pulmonary:     Effort: Pulmonary effort is normal. No respiratory distress.  Skin:    Findings: No rash.  Neurological:     Mental Status: She is alert and oriented to person, place, and time.     Cranial Nerves: No cranial nerve deficit, dysarthria or facial asymmetry.     Sensory: No sensory deficit.  Psychiatric:        Mood and Affect: Mood normal.     ED Results / Procedures / Treatments   Labs (all labs ordered are listed, but only abnormal results are displayed) Labs Reviewed  CBC WITH DIFFERENTIAL/PLATELET  BASIC METABOLIC PANEL    EKG None  Radiology No results found.  Procedures Procedures   Medications Ordered in ED Medications - No  data to display  ED Course/ Medical Decision Making/ A&P                             Medical Decision Making Amount and/or Complexity of Data Reviewed Labs: ordered. Radiology: ordered.   32 year old female presenting with a headache. Emergent considerations for headache include subarachnoid hemorrhage, meningitis, temporal arteritis, glaucoma, cerebral ischemia, carotid/vertebral dissection, intracranial tumor, Venous sinus thrombosis, carbon monoxide poisoning, acute or chronic subdural hemorrhage.    This is not an exhaustive differential.    Past Medical History / Co-morbidities / Social History: Lupus     Physical Exam: Pertinent physical exam findings  include Completely normal head to toe neuroexam.   Medications: Declined   MDM/Disposition: This is a 32 year old female who presents today due to headache.  Reports has been going on for around a week but acutely worsened while she was laughing during video today.  Headache is positional, worse with Valsalva and was acutely worse when she left this evening.  She has enough red flags to warrant CT angio.  This imaging and her labs are pending at this time.  Patient will be signed out to oncoming PA, Quincy Carnes.  Please see her note for results and ultimate disposition.      Final Clinical Impression(s) / ED Diagnoses Final diagnoses:  None    Rx / DC Orders ED Discharge Orders     None      I discussed this case with my attending physician Dr. Laverta Baltimore who cosigned this note including patient's presenting symptoms, physical exam, and planned diagnostics and interventions. Attending physician stated agreement with plan or made changes to plan which were implemented.      Darliss Ridgel 03/02/22 2210    Margette Fast, MD 03/02/22 [id number]

## 2022-03-02 NOTE — ED Triage Notes (Signed)
Pt presents with headache since last Wednesday.  States it comes and goes with intensity. Over left eyebrow and over the top of her head. She is concerned it may be a side effect of starting Synthroid last Tuesday.  Hx of lupus.

## 2022-03-02 NOTE — Discharge Instructions (Signed)
Follow-up with your primary care doctor--copies of labs and CT On back.

## 2022-03-10 ENCOUNTER — Other Ambulatory Visit: Payer: Self-pay | Admitting: Family Medicine

## 2022-03-10 DIAGNOSIS — D839 Common variable immunodeficiency, unspecified: Secondary | ICD-10-CM | POA: Diagnosis not present

## 2022-04-04 ENCOUNTER — Encounter: Payer: Self-pay | Admitting: Family Medicine

## 2022-04-04 ENCOUNTER — Ambulatory Visit (INDEPENDENT_AMBULATORY_CARE_PROVIDER_SITE_OTHER): Payer: BC Managed Care – PPO | Admitting: Family Medicine

## 2022-04-04 VITALS — BP 116/80 | HR 96 | Temp 98.0°F | Wt 321.0 lb

## 2022-04-04 DIAGNOSIS — F410 Panic disorder [episodic paroxysmal anxiety] without agoraphobia: Secondary | ICD-10-CM | POA: Diagnosis not present

## 2022-04-04 DIAGNOSIS — E038 Other specified hypothyroidism: Secondary | ICD-10-CM | POA: Diagnosis not present

## 2022-04-04 DIAGNOSIS — F419 Anxiety disorder, unspecified: Secondary | ICD-10-CM | POA: Diagnosis not present

## 2022-04-04 DIAGNOSIS — E063 Autoimmune thyroiditis: Secondary | ICD-10-CM | POA: Diagnosis not present

## 2022-04-04 LAB — T4, FREE: Free T4: 0.67 ng/dL (ref 0.60–1.60)

## 2022-04-04 LAB — TSH: TSH: 3.24 u[IU]/mL (ref 0.35–5.50)

## 2022-04-04 NOTE — Patient Instructions (Addendum)
Return if symptoms worsen or fail to improve.        Great to see you today.  I have refilled the medication(s) we provide.   If labs were collected, we will inform you of lab results once received either by echart message or telephone call.   - echart message- for normal results that have been seen by the patient already.   - telephone call: abnormal results or if patient has not viewed results in their echart.  

## 2022-04-04 NOTE — Progress Notes (Signed)
Patient ID: Ragini Mehlhaff, female  DOB: [birth year], 32 y.o.   MRN: [id number] Patient Care Team    Relationship Specialty Notifications Start End  Ma Hillock, DO PCP - General Family Medicine  02/18/20   Lahoma Rocker, MD Consulting Physician Rheumatology  02/18/20   Garnet Sierras, DO Consulting Physician Allergy  02/18/20   Marylynn Pearson, MD Consulting Physician Obstetrics and Gynecology  02/18/20   Konrad Felix, MD Referring Physician Ophthalmology  02/18/20   Armbruster, Carlota Raspberry, MD Consulting Physician Gastroenterology  02/18/20   Spero Geralds, MD Consulting Physician Pulmonary Disease  12/08/20     Chief Complaint  Patient presents with   Hypothyroidism    Subjective:  Bamma Angelo is a 32 y.o. female present for routine chronic conditions. All past medical history, surgical history, allergies, family history, immunizations, medications and social history were updated in the electronic medical record today. All recent labs, ED visits and hospitalizations within the last year were reviewed.  Hypothyroidism: Started on levothyroxine 25 mcg daily last visit secondary to mildly elevated TSH 4.87, with complaints of fatigue.  Anxiety: Patient reports compliance with Cymbalta 40 mg daily.  She states medication is working well l for her today. Prior note: Pt presents for follow-up on her new onset anxiety and panic after starting Cymbalta 30 mg 4 weeks ago.   She reports she definitely has noticed a difference since starting Cymbalta and she feels her panic has improved.  She does not wake up panicked any longer.  She still is having significant anxiety when driving, especially if behind large trucks or if the weather is bad.    04/22/2021    8:16 AM 12/08/2020    9:25 AM 11/04/2020   10:02 AM 02/18/2020   10:39 AM  Depression screen PHQ 2/9  Decreased Interest 1 1 1  0  Down, Depressed, Hopeless 1 1 1  0  PHQ - 2 Score 2 2 2  0  Altered sleeping 2 1 2    Tired, decreased  energy 3 2 3    Change in appetite 1 1 3    Feeling bad or failure about yourself  1 1 2    Trouble concentrating 3 1 1    Moving slowly or fidgety/restless 1 0 0   Suicidal thoughts 0 0 0   PHQ-9 Score 13 8 13        04/22/2021    8:16 AM 12/08/2020    9:27 AM 11/04/2020   10:02 AM  GAD 7 : Generalized Anxiety Score  Nervous, Anxious, on Edge 0 2 3  Control/stop worrying 0 2 3  Worry too much - different things 0 1 3  Trouble relaxing 0 1 2  Restless 0 0 1  Easily annoyed or irritable 1 2 2   Afraid - awful might happen 0 1 3  Total GAD 7 Score 1 9 17           02/18/2020   12:58 PM  Fall Risk   Falls in the past year? 0  Number falls in past yr: 0  Injury with Fall? 0  Follow up Falls evaluation completed   Immunization History  Administered Date(s) Administered   HPV Quadrivalent 07/23/2006, 10/04/2006   Hepatitis B, PED/ADOLESCENT Jul [birth year] 1991, 01/14/1992, 06/11/1992   Hpv-Unspecified 07/23/2006, 10/04/2006, 01/31/2007   IPV 04/13/1992, 06/11/1992, 06/15/1993, 02/19/1996   Influenza,inj,quad, With Preservative 10/04/2015   MMR 03/14/1993, 12/11/1996   Meningococcal Conjugate 07/23/2006, 07/23/2006   Moderna Sars-Covid-2 Vaccination 01/09/2020, 02/06/2020   Tdap 02/13/1992, 04/13/1992, 06/11/1992, 06/15/1993,  02/19/1996, 06/23/2006, 07/23/2006, 01/03/2016, 08/29/2016, 09/09/2016   Past Medical History:  Diagnosis Date   Allergy    Anxiety    Arthritis    Blood in stool    Chicken pox    Chronic ITP (idiopathic thrombocytopenia)    Colon polyps 02/28/2018   Adenoma-repeat colonoscopy 2 years   COVID-19 07/29/2020   CVID (common variable immunodeficiency)    GERD (gastroesophageal reflux disease)    H/O stem cell transplant 2014   Hiatal hernia 02/28/2018   Small hiatal hernia on endoscopy, gastritis.  Biopsies negative for H. pylori and celiac   History of asthma 06/26/2019   Lupus    Lupus encephalitis 2013   Hospitalized   Osteomyelitis 2018   Hospitalized  foot/ankle   Polyp of colon 02/27/2018   Raynaud's disease    Recurrent upper respiratory infection (URI)    UTI (urinary tract infection)    Allergies  Allergen Reactions   Pseudoephedrine Other (See Comments)    Joint swelling   Past Surgical History:  Procedure Laterality Date   COLONOSCOPY  02/28/2018   Adenoma-repeat colonoscopy 2 years   CYST EXCISION Right 2020   Benign cyst excision right arm x2   ESOPHAGOGASTRODUODENOSCOPY  02/28/2018   Small hiatal hernia, mild gastritis, biopsies negative for H. pylori and celiac   GANGLION CYST EXCISION Right 2007   Right wrist   HARDWARE REMOVAL Right 2020   Right ankle plate and screws removal   ORIF ANKLE FRACTURE Right 2019   Plates and screws placed (removed in 2020)   Family History  Problem Relation Age of Onset   Urticaria Mother    Asthma Mother    Depression Mother    Hypertension Mother    Miscarriages / [place] Mother    Colon polyps Mother    Eczema Brother    Arthritis Brother    Alcohol abuse Father    Heart attack Father    Allergic rhinitis Neg Hx    Colon cancer Neg Hx    Esophageal cancer Neg Hx    Stomach cancer Neg Hx    Social History   Social History Narrative   Marital status/children/pets: single   Education/employment: masters degree. Employed as a Product manager:      -smoke alarm in the home:No     - wears seatbelt: No     - Feels safe in their relationships: No    Allergies as of 04/04/2022       Reactions   Pseudoephedrine Other (See Comments)   Joint swelling        Medication List        Accurate as of April 04, 2022  9:38 AM. If you have any questions, ask your nurse or doctor.          DULoxetine 20 MG capsule Commonly known as: CYMBALTA Take 2 capsules (40 mg total) by mouth daily.   hydroxychloroquine 200 MG tablet Commonly known as: PLAQUENIL Take 400 mg by mouth daily.   Immune Globulin 10% 10G/184mL (10,000mg /138mL) Soln Generic drug:  Immune Globulin 10% Inject into the vein.   ipratropium 0.03 % nasal spray Commonly known as: ATROVENT Place 1-2 sprays into both nostrils 2 (two) times daily as needed (nasal drainage).   Jencycla 0.35 MG tablet Generic drug: norethindrone Take 1 tablet by mouth daily.   levothyroxine 25 MCG tablet Commonly known as: SYNTHROID Take 1 tablet (25 mcg total) by mouth daily.       All  past medical history, surgical history, allergies, family history, immunizations andmedications were updated in the EMR today and reviewed under the history and medication portions of their EMR.       ROS 14 pt review of systems performed and negative (unless mentioned in an HPI)  Objective: BP 116/80   Pulse 96   Temp 98 F (36.7 C)   Wt (!) 321 lb (145.6 kg)   SpO2 100%   BMI 48.81 kg/m  Physical Exam Vitals and nursing note reviewed.  Constitutional:      General: She is not in acute distress.    Appearance: Normal appearance. She is not ill-appearing, toxic-appearing or diaphoretic.  HENT:     Head: Normocephalic and atraumatic.  Eyes:     General: No scleral icterus.       Right eye: No discharge.        Left eye: No discharge.     Extraocular Movements: Extraocular movements intact.     Conjunctiva/sclera: Conjunctivae normal.     Pupils: Pupils are equal, round, and reactive to light.  Cardiovascular:     Rate and Rhythm: Normal rate and regular rhythm.  Pulmonary:     Effort: Pulmonary effort is normal. No respiratory distress.     Breath sounds: Normal breath sounds. No wheezing, rhonchi or rales.  Musculoskeletal:     Right lower leg: No edema.     Left lower leg: No edema.  Skin:    General: Skin is warm.     Findings: No rash.  Neurological:     Mental Status: She is alert and oriented to person, place, and time. Mental status is at baseline.     Motor: No weakness.     Gait: Gait normal.  Psychiatric:        Mood and Affect: Mood normal.        Behavior:  Behavior normal.        Thought Content: Thought content normal.        Judgment: Judgment normal.    No results found.  Assessment/plan: Ayan Oxendine is a 32 y.o. female present for routine chronic conditions Anxiety/panic Stable Continue Cymbalta 40 mg daily  Lupus (HCC)/CVID (common variable immunodeficiency) ([place]) Managed by rheum and allergy  Hypothyroidism: TSH and T4 free collected today.  Refills will be called in an appropriate dose of levothyroxine for her. Goal TSH 2.50 or less 32 year old female.  Return if symptoms worsen or fail to improve.   Orders Placed This Encounter  Procedures   T4, free   TSH   No orders of the defined types were placed in this encounter.  Referral Orders  No referral(s) requested today     Note is dictated utilizing voice recognition software. Although note has been proof read prior to signing, occasional typographical errors still can be missed. If any questions arise, please do not hesitate to call for verification.  Electronically signed by: Howard Pouch, DO [place]

## 2022-04-05 ENCOUNTER — Telehealth: Payer: Self-pay | Admitting: Family Medicine

## 2022-04-05 MED ORDER — LEVOTHYROXINE SODIUM 50 MCG PO TABS: 25.0000 ug | ORAL_TABLET | Freq: Every day | ORAL | 3 refills | Status: DC

## 2022-04-05 NOTE — Telephone Encounter (Signed)
Pt advised of results/recommendations. 

## 2022-04-05 NOTE — Telephone Encounter (Signed)
Please inform patient her thyroid levels are improving, but not in the range we discussed for her goal.  I have called in the increased dose of levothyroxine 50 mcg daily.  She can finish the bottle she has by taking 2 of the 25 mcg tablets that she currently has. We will recollect her thyroid levels at her next routine follow-up appointment

## 2022-04-06 DIAGNOSIS — J310 Chronic rhinitis: Secondary | ICD-10-CM | POA: Diagnosis not present

## 2022-04-06 DIAGNOSIS — D839 Common variable immunodeficiency, unspecified: Secondary | ICD-10-CM | POA: Diagnosis not present

## 2022-04-07 DIAGNOSIS — D839 Common variable immunodeficiency, unspecified: Secondary | ICD-10-CM | POA: Diagnosis not present

## 2022-04-12 LAB — COMPREHENSIVE METABOLIC PANEL
ALT: 12 IU/L (ref 0–32)
AST: 19 IU/L (ref 0–40)
Albumin/Globulin Ratio: 1.8 (ref 1.2–2.2)
Albumin: 4.5 g/dL (ref 4.0–5.0)
Alkaline Phosphatase: 91 IU/L (ref 44–121)
BUN/Creatinine Ratio: 10 (ref 9–23)
BUN: 9 mg/dL (ref 6–20)
Bilirubin Total: <0.2 mg/dL (ref 0.0–1.2)
CO2: 23 mmol/L (ref 20–29)
Calcium: 9.2 mg/dL (ref 8.7–10.2)
Chloride: 102 mmol/L (ref 96–106)
Creatinine, Ser: 0.88 mg/dL (ref 0.57–1.00)
Globulin, Total: 2.5 g/dL (ref 1.5–4.5)
Glucose: 69 mg/dL — ABNORMAL LOW (ref 70–99)
Potassium: 4.1 mmol/L (ref 3.5–5.2)
Sodium: 140 mmol/L (ref 134–144)
Total Protein: 7 g/dL (ref 6.0–8.5)
eGFR: 91 mL/min/{1.73_m2} (ref >59)

## 2022-04-12 LAB — CBC WITH DIFFERENTIAL/PLATELET
Basophils Absolute: 0 10*3/uL (ref 0.0–0.2)
Basos: 0 %
EOS (ABSOLUTE): 0.1 10*3/uL (ref 0.0–0.4)
Eos: 1 %
Hematocrit: 41.8 % (ref 34.0–46.6)
Hemoglobin: 14.1 g/dL (ref 11.1–15.9)
Immature Grans (Abs): 0 10*3/uL (ref 0.0–0.1)
Immature Granulocytes: 0 %
Lymphocytes Absolute: 2.3 10*3/uL (ref 0.7–3.1)
Lymphs: 25 %
MCH: 27.5 pg (ref 26.6–33.0)
MCHC: 33.7 g/dL (ref 31.5–35.7)
MCV: 82 fL (ref 79–97)
Monocytes Absolute: 0.7 10*3/uL (ref 0.1–0.9)
Monocytes: 8 %
Neutrophils Absolute: 5.9 10*3/uL (ref 1.4–7.0)
Neutrophils: 66 %
Platelets: 269 10*3/uL (ref 150–450)
RBC: 5.13 x10E6/uL (ref 3.77–5.28)
RDW: 12.9 % (ref 11.7–15.4)
WBC: 9.1 10*3/uL (ref 3.4–10.8)

## 2022-04-12 LAB — ALLERGENS W/TOTAL IGE AREA 2
Alternaria Alternata IgE: <0.1 kU/L
Aspergillus Fumigatus IgE: <0.1 kU/L
Bermuda Grass IgE: <0.1 kU/L
Cat Dander IgE: <0.1 kU/L
Cedar, Mountain IgE: <0.1 kU/L
Cladosporium Herbarum IgE: <0.1 kU/L
Cockroach, German IgE: <0.1 kU/L
Common Silver Birch IgE: <0.1 kU/L
Cottonwood IgE: <0.1 kU/L
D Farinae IgE: <0.1 kU/L
D Pteronyssinus IgE: <0.1 kU/L
Dog Dander IgE: <0.1 kU/L
Elm, American IgE: <0.1 kU/L
IgE (Immunoglobulin E), Serum: 2 IU/mL — ABNORMAL LOW (ref 6–495)
Johnson Grass IgE: <0.1 kU/L
Maple/Box Elder IgE: <0.1 kU/L
Mouse Urine IgE: <0.1 kU/L
Oak, White IgE: <0.1 kU/L
Pecan, Hickory IgE: <0.1 kU/L
Penicillium Chrysogen IgE: <0.1 kU/L
Pigweed, Rough IgE: <0.1 kU/L
Ragweed, Short IgE: <0.1 kU/L
Sheep Sorrel IgE Qn: <0.1 kU/L
Timothy Grass IgE: <0.1 kU/L
White Mulberry IgE: <0.1 kU/L

## 2022-04-12 LAB — SEDIMENTATION RATE: Sed Rate: 12 mm/hr (ref 0–32)

## 2022-04-12 LAB — IGG, IGA, IGM
IgA/Immunoglobulin A, Serum: 23 mg/dL — ABNORMAL LOW (ref 87–352)
IgG (Immunoglobin G), Serum: 1059 mg/dL (ref 586–1602)
IgM (Immunoglobulin M), Srm: 21 mg/dL — ABNORMAL LOW (ref 26–217)

## 2022-04-19 ENCOUNTER — Encounter: Payer: Self-pay | Admitting: Family Medicine

## 2022-04-20 ENCOUNTER — Encounter: Payer: Self-pay | Admitting: Family Medicine

## 2022-04-20 ENCOUNTER — Ambulatory Visit (INDEPENDENT_AMBULATORY_CARE_PROVIDER_SITE_OTHER): Payer: BC Managed Care – PPO | Admitting: Family Medicine

## 2022-04-20 VITALS — BP 120/87 | HR 84 | Temp 98.2°F | Wt 317.4 lb

## 2022-04-20 DIAGNOSIS — R599 Enlarged lymph nodes, unspecified: Secondary | ICD-10-CM | POA: Diagnosis not present

## 2022-04-20 DIAGNOSIS — J039 Acute tonsillitis, unspecified: Secondary | ICD-10-CM

## 2022-04-20 MED ORDER — AZITHROMYCIN 250 MG PO TABS: ORAL_TABLET | ORAL | 0 refills | Status: AC

## 2022-04-20 NOTE — Progress Notes (Signed)
Shirley Carr , [birth year], 32 y.o., female MRN: [id number] Patient Care Team    Relationship Specialty Notifications Start End  Natalia Leatherwood, DO PCP - General Family Medicine  02/18/20   Casimer Lanius, MD Consulting Physician Rheumatology  02/18/20   Ellamae Sia, DO Consulting Physician Allergy  02/18/20   Zelphia Cairo, MD Consulting Physician Obstetrics and Gynecology  02/18/20   Grant Fontana, MD Referring Physician Ophthalmology  02/18/20   Armbruster, Willaim Rayas, MD Consulting Physician Gastroenterology  02/18/20   Charlott Holler, MD Consulting Physician Pulmonary Disease  12/08/20     Chief Complaint  Patient presents with   Oral Swelling    Feels like start of allergic reactions since Sunday after has not gotten better or worse; or swollen glands     Subjective: Shirley Carr is a 32 y.o. Pt presents for an OV with complaints of scratchy throat and throat swelling of 5 duration.  Associated symptoms include fatigue.  Patient states her she is just feeling like her throat started to swell a little, like at the beginning of an allergic reaction.  She does not feel she is having allergic reaction.  She feels she can breathe perfectly fine.  She denies any fevers or chills.  She denies any rash.  Symptoms have been present for 5 days and not worsening.     04/22/2021    8:16 AM 12/08/2020    9:25 AM 11/04/2020   10:02 AM 02/18/2020   10:39 AM  Depression screen PHQ 2/9  Decreased Interest 0  Down, Depressed, Hopeless 0  PHQ - 2 Score 0  Altered sleeping Tired, decreased energy Change in appetite Feeling bad or failure about yourself  Trouble concentrating Moving slowly or fidgety/restless 1 0 0   Suicidal thoughts 0 0 0   PHQ-9 Score Allergies  Allergen Reactions   Pseudoephedrine Other (See Comments)    Joint swelling   Social History   Social History Narrative   Marital  status/children/pets: single   Education/employment: masters degree. Employed as a Gaffer:      -smoke alarm in the home:No     - wears seatbelt: No     - Feels safe in their relationships: No   Past Medical History:  Diagnosis Date   Allergy    Anxiety    Arthritis    Blood in stool    Chicken pox    Chronic ITP (idiopathic thrombocytopenia)    Colon polyps 02/28/2018   Adenoma-repeat colonoscopy 2 years   COVID-19 07/29/2020   CVID (common variable immunodeficiency)    GERD (gastroesophageal reflux disease)    H/O stem cell transplant 2014   Hiatal hernia 02/28/2018   Small hiatal hernia on endoscopy, gastritis.  Biopsies negative for H. pylori and celiac   History of asthma 06/26/2019   Lupus    Lupus encephalitis 2013   Hospitalized   Osteomyelitis 2018   Hospitalized foot/ankle   Polyp of colon 02/27/2018   Raynaud's disease    Recurrent upper respiratory infection (URI)    UTI (urinary tract infection)    Past Surgical History:  Procedure Laterality Date   COLONOSCOPY  02/28/2018   Adenoma-repeat colonoscopy 2 years  CYST EXCISION Right 2020   Benign cyst excision right arm x2   ESOPHAGOGASTRODUODENOSCOPY  02/28/2018   Small hiatal hernia, mild gastritis, biopsies negative for H. pylori and celiac   GANGLION CYST EXCISION Right 2007   Right wrist   HARDWARE REMOVAL Right 2020   Right ankle plate and screws removal   ORIF ANKLE FRACTURE Right 2019   Plates and screws placed (removed in 2020)   Family History  Problem Relation Age of Onset   Urticaria Mother    Asthma Mother    Depression Mother    Hypertension Mother    Miscarriages / [place] Mother    Colon polyps Mother    Eczema Brother    Arthritis Brother    Alcohol abuse Father    Heart attack Father    Allergic rhinitis Neg Hx    Colon cancer Neg Hx    Esophageal cancer Neg Hx    Stomach cancer Neg Hx    Allergies as of 04/20/2022       Reactions    Pseudoephedrine Other (See Comments)   Joint swelling        Medication List        Accurate as of April 20, 2022 10:42 AM. If you have any questions, ask your nurse or doctor.          azithromycin 250 MG tablet Commonly known as: ZITHROMAX Take 2 tablets on day 1, then 1 tablet daily on days 2 through 5 Started by: Felix Pacini, DO   DULoxetine 20 MG capsule Commonly known as: CYMBALTA Take 2 capsules (40 mg total) by mouth daily.   hydroxychloroquine 200 MG tablet Commonly known as: PLAQUENIL Take 400 mg by mouth daily.   Immune Globulin 10% 10G/141mL (10,000mg /154mL) Soln Generic drug: Immune Globulin 10% Inject into the vein.   ipratropium 0.03 % nasal spray Commonly known as: ATROVENT Place 1-2 sprays into both nostrils 2 (two) times daily as needed (nasal drainage).   Jencycla 0.35 MG tablet Generic drug: norethindrone Take 1 tablet by mouth daily.   levothyroxine 50 MCG tablet Commonly known as: SYNTHROID Take 0.5 tablets (25 mcg total) by mouth daily.        All past medical history, surgical history, allergies, family history, immunizations andmedications were updated in the EMR today and reviewed under the history and medication portions of their EMR.     ROS Negative, with the exception of above mentioned in HPI   Objective:  BP 120/87   Pulse 84   Temp 98.2 F (36.8 C)   Wt (!) 317 lb 6.4 oz (144 kg)   SpO2 99%   BMI 48.26 kg/m  Body mass index is 48.26 kg/m.  Physical Exam Vitals and nursing note reviewed.  Constitutional:      General: She is not in acute distress.    Appearance: Normal appearance. She is normal weight. She is not ill-appearing or toxic-appearing.  HENT:     Head: Normocephalic and atraumatic.     Right Ear: Tympanic membrane, ear canal and external ear normal.     Left Ear: Tympanic membrane, ear canal and external ear normal.     Nose: No congestion or rhinorrhea.     Mouth/Throat:     Mouth: Mucous  membranes are moist.     Pharynx: Posterior oropharyngeal erythema (Mild) present. No oropharyngeal exudate.     Tonsils: No tonsillar exudate or tonsillar abscesses. 1+ on the right.  Eyes:     General: No scleral  icterus.       Right eye: No discharge.        Left eye: No discharge.     Extraocular Movements: Extraocular movements intact.     Conjunctiva/sclera: Conjunctivae normal.     Pupils: Pupils are equal, round, and reactive to light.  Skin:    Findings: No rash.  Neurological:     Mental Status: She is alert and oriented to person, place, and time. Mental status is at baseline.     Motor: No weakness.     Coordination: Coordination normal.     Gait: Gait normal.  Psychiatric:        Mood and Affect: Mood normal.        Behavior: Behavior normal.        Thought Content: Thought content normal.        Judgment: Judgment normal.      No results found. No results found. No results found for this or any previous visit (from the past 24 hour(s)).  Assessment/Plan: Shatoria Godleski is a 32 y.o. female present for OV for  Tonsillitis/Adenopathy Patient has mild swelling in her right tonsil with swollen lymph nodes on that side.  There is no exudates and there is fevers or chills suggesting strep throat currently.  We discussed this is most likely viral. Rest, hydrate.  Salt water gargle recommended. Start azithromycin Saturday if symptoms or not improving, sooner if worsening. Follow-up in 2 weeks if needed Reviewed expectations re: course of current medical issues. Discussed self-management of symptoms. Outlined signs and symptoms indicating need for more acute intervention. Patient verbalized understanding and all questions were answered. Patient received an After-Visit Summary.    No orders of the defined types were placed in this encounter.  Meds ordered this encounter  Medications   azithromycin (ZITHROMAX) 250 MG tablet    Sig: Take 2 tablets on day 1, then 1  tablet daily on days 2 through 5    Dispense:  6 tablet    Refill:  0   Referral Orders  No referral(s) requested today     Note is dictated utilizing voice recognition software. Although note has been proof read prior to signing, occasional typographical errors still can be missed. If any questions arise, please do not hesitate to call for verification.   electronically signed by:  Felix Pacini, DO  [facility] [place]

## 2022-05-09 DIAGNOSIS — D839 Common variable immunodeficiency, unspecified: Secondary | ICD-10-CM | POA: Diagnosis not present

## 2022-05-19 ENCOUNTER — Other Ambulatory Visit: Payer: Self-pay | Admitting: Family Medicine

## 2022-06-06 DIAGNOSIS — D839 Common variable immunodeficiency, unspecified: Secondary | ICD-10-CM | POA: Diagnosis not present

## 2022-07-04 DIAGNOSIS — D839 Common variable immunodeficiency, unspecified: Secondary | ICD-10-CM | POA: Diagnosis not present

## 2022-07-11 ENCOUNTER — Encounter: Payer: Self-pay | Admitting: Family Medicine

## 2022-07-12 ENCOUNTER — Encounter: Payer: Self-pay | Admitting: Family Medicine

## 2022-07-12 ENCOUNTER — Ambulatory Visit (INDEPENDENT_AMBULATORY_CARE_PROVIDER_SITE_OTHER): Payer: BC Managed Care – PPO | Admitting: Family Medicine

## 2022-07-12 VITALS — BP 124/86 | HR 85 | Temp 98.2°F | Wt 314.8 lb

## 2022-07-12 DIAGNOSIS — D693 Immune thrombocytopenic purpura: Secondary | ICD-10-CM

## 2022-07-12 DIAGNOSIS — F419 Anxiety disorder, unspecified: Secondary | ICD-10-CM | POA: Diagnosis not present

## 2022-07-12 DIAGNOSIS — R5383 Other fatigue: Secondary | ICD-10-CM

## 2022-07-12 DIAGNOSIS — D839 Common variable immunodeficiency, unspecified: Secondary | ICD-10-CM | POA: Diagnosis not present

## 2022-07-12 DIAGNOSIS — E063 Autoimmune thyroiditis: Secondary | ICD-10-CM

## 2022-07-12 DIAGNOSIS — R0683 Snoring: Secondary | ICD-10-CM

## 2022-07-12 DIAGNOSIS — E038 Other specified hypothyroidism: Secondary | ICD-10-CM

## 2022-07-12 DIAGNOSIS — IMO0002 Reserved for concepts with insufficient information to code with codable children: Secondary | ICD-10-CM

## 2022-07-12 DIAGNOSIS — R400 Somnolence: Secondary | ICD-10-CM

## 2022-07-12 DIAGNOSIS — M329 Systemic lupus erythematosus, unspecified: Secondary | ICD-10-CM | POA: Diagnosis not present

## 2022-07-12 DIAGNOSIS — F410 Panic disorder [episodic paroxysmal anxiety] without agoraphobia: Secondary | ICD-10-CM

## 2022-07-12 DIAGNOSIS — Z6841 Body Mass Index (BMI) 40.0 and over, adult: Secondary | ICD-10-CM

## 2022-07-12 LAB — CBC WITH DIFFERENTIAL/PLATELET
Basophils Absolute: 0 10*3/uL (ref 0.0–0.1)
Basophils Relative: 0.8 % (ref 0.0–3.0)
Eosinophils Absolute: 0.1 10*3/uL (ref 0.0–0.7)
Eosinophils Relative: 1.1 % (ref 0.0–5.0)
HCT: 43.5 % (ref 36.0–46.0)
Hemoglobin: 14.4 g/dL (ref 12.0–15.0)
Lymphocytes Relative: 33.1 % (ref 12.0–46.0)
Lymphs Abs: 1.8 10*3/uL (ref 0.7–4.0)
MCHC: 33.1 g/dL (ref 30.0–36.0)
MCV: 82.9 fl (ref 78.0–100.0)
Monocytes Absolute: 0.4 10*3/uL (ref 0.1–1.0)
Monocytes Relative: 7.8 % (ref 3.0–12.0)
Neutro Abs: 3.1 10*3/uL (ref 1.4–7.7)
Neutrophils Relative %: 57.2 % (ref 43.0–77.0)
Platelets: 259 10*3/uL (ref 150.0–400.0)
RBC: 5.25 Mil/uL — ABNORMAL HIGH (ref 3.87–5.11)
RDW: 14.2 % (ref 11.5–15.5)
WBC: 5.5 10*3/uL (ref 4.0–10.5)

## 2022-07-12 LAB — IBC + FERRITIN
Ferritin: 30.6 ng/mL (ref 10.0–291.0)
Iron: 57 ug/dL (ref 42–145)
Saturation Ratios: 11.8 % — ABNORMAL LOW (ref 20.0–50.0)
TIBC: 484.4 ug/dL — ABNORMAL HIGH (ref 250.0–450.0)
Transferrin: 346 mg/dL (ref 212.0–360.0)

## 2022-07-12 LAB — TSH: TSH: 2.67 u[IU]/mL (ref 0.35–5.50)

## 2022-07-12 LAB — B12 AND FOLATE PANEL
Folate: 5.7 ng/mL — ABNORMAL LOW (ref >5.9)
Vitamin B-12: 201 pg/mL — ABNORMAL LOW (ref 211–911)

## 2022-07-12 LAB — VITAMIN D 25 HYDROXY (VIT D DEFICIENCY, FRACTURES): VITD: 22.09 ng/mL — ABNORMAL LOW (ref 30.00–100.00)

## 2022-07-12 LAB — SEDIMENTATION RATE: Sed Rate: 24 mm/hr — ABNORMAL HIGH (ref 0–20)

## 2022-07-12 MED ORDER — DULOXETINE HCL 20 MG PO CPEP: 40.0000 mg | ORAL_CAPSULE | Freq: Every day | ORAL | 1 refills | Status: DC

## 2022-07-12 NOTE — Progress Notes (Signed)
Patient ID: Shirley Carr, female  DOB: 28-Jan-1990, 32 y.o.   MRN: [id number] Patient Care Team    Relationship Specialty Notifications Start End  Natalia Leatherwood, DO PCP - General Family Medicine  02/18/20   Casimer Lanius, MD Consulting Physician Rheumatology  02/18/20   Ellamae Sia, DO Consulting Physician Allergy  02/18/20   Zelphia Cairo, MD Consulting Physician Obstetrics and Gynecology  02/18/20   Grant Fontana, MD Referring Physician Ophthalmology  02/18/20   Armbruster, Willaim Rayas, MD Consulting Physician Gastroenterology  02/18/20   Charlott Holler, MD Consulting Physician Pulmonary Disease  12/08/20     Chief Complaint  Patient presents with   Fatigue    Has gotten progressively worse over the last month    Subjective:  Shirley Carr is a 32 y.o. female present for routine chronic conditions. All past medical history, surgical history, allergies, family history, immunizations, medications and social history were updated in the electronic medical record today. All recent labs, ED visits and hospitalizations within the last year were reviewed.  Hypothyroidism: Started on levothyroxine 50 mcg daily last visit secondary to mildly elevated TSH 4.87, with complaints of fatigue.  Reports her fatigue has worsened over the last month.  She has a history of lupus and is prescribed Plaquenil.  She reports she does snore.  She states she is sleeping a great deal and never feels rested.  She is not motivated.  She denies any other associated symptoms.  She has had a mildly elevated ESR and hilar lymphadenopathy that resolved on CT in the past.  Anxiety: Patient reports compliance with Cymbalta 40 mg daily.  She states medication is working well  for her today.  Prior note: Pt presents for follow-up on her new onset anxiety and panic after starting Cymbalta 30 mg 4 weeks ago.   She reports she definitely has noticed a difference since starting Cymbalta and she feels her panic has improved.   She does not wake up panicked any longer.  She still is having significant anxiety when driving, especially if behind large trucks or if the weather is bad.    07/12/2022   10:25 AM 04/22/2021    8:16 AM 12/08/2020    9:25 AM 11/04/2020   10:02 AM 02/18/2020   10:39 AM  Depression screen PHQ 2/9  Decreased Interest 2 1 1 1  0  Down, Depressed, Hopeless 0 1 1 1  0  PHQ - 2 Score 2 2 2 2  0  Altered sleeping 3 2 1 2    Tired, decreased energy 3 3 2 3    Change in appetite 0 1 1 3    Feeling bad or failure about yourself  0 1 1 2    Trouble concentrating 3 3 1 1    Moving slowly or fidgety/restless 0 1 0 0   Suicidal thoughts 0 0 0 0   PHQ-9 Score 11 13 8 13        07/12/2022   10:25 AM 04/22/2021    8:16 AM 12/08/2020    9:27 AM 11/04/2020   10:02 AM  GAD 7 : Generalized Anxiety Score  Nervous, Anxious, on Edge 0 0 2 3  Control/stop worrying 0 0 2 3  Worry too much - different things 0 0 1 3  Trouble relaxing 0 0 1 2  Restless 0 0 0 1  Easily annoyed or irritable 0 1 2 2   Afraid - awful might happen 0 0 1 3  Total GAD 7 Score 0 1  9 17          07/12/2022   10:25 AM 04/20/2022   10:40 AM 02/18/2020   12:58 PM  Fall Risk   Falls in the past year? 0 0 0  Number falls in past yr: 0 0 0  Injury with Fall? 0 0 0  Risk for fall due to : No Fall Risks    Follow up Falls evaluation completed Falls evaluation completed Falls evaluation completed   Immunization History  Administered Date(s) Administered   HPV Quadrivalent 07/23/2006, 10/04/2006   Hepatitis B, PED/ADOLESCENT 11[birth year]19[birth year]1991, 01/14/1992, 06/11/1992   Hpv-Unspecified 07/23/2006, 10/04/2006, 01/31/2007   IPV 04/13/1992, 06/11/1992, 06/15/1993, 02/19/1996   Influenza,inj,quad, With Preservative 10/04/2015   MMR 03/14/1993, 12/11/1996   Meningococcal Conjugate 07/23/2006, 07/23/2006   Moderna Sars-Covid-2 Vaccination 01/09/2020, 02/06/2020   Tdap 02/13/1992, 04/13/1992, 06/11/1992, 06/15/1993, 02/19/1996, 06/23/2006, 07/23/2006,  01/03/2016, 08/29/2016, 09/09/2016   Past Medical History:  Diagnosis Date   Allergy    Anxiety    Arthritis    Blood in stool    Chicken pox    Chronic ITP (idiopathic thrombocytopenia) (HCC)    Colon polyps 02/28/2018   Adenoma-repeat colonoscopy 2 years   COVID-19 07/29/2020   CVID (common variable immunodeficiency) (HCC)    GERD (gastroesophageal reflux disease)    H/O stem cell transplant (HCC) 2014   Hiatal hernia 02/28/2018   Small hiatal hernia on endoscopy, gastritis.  Biopsies negative for H. pylori and celiac   History of asthma 06/26/2019   Lupus (HCC)    Lupus encephalitis (HCC) 2013   Hospitalized   Osteomyelitis (HCC) 2018   Hospitalized foot/ankle   Polyp of colon 02/27/2018   Raynaud's disease    Recurrent upper respiratory infection (URI)    UTI (urinary tract infection)    Allergies  Allergen Reactions   Pseudoephedrine Other (See Comments)    Joint swelling   Past Surgical History:  Procedure Laterality Date   COLONOSCOPY  02/28/2018   Adenoma-repeat colonoscopy 2 years   CYST EXCISION Right 2020   Benign cyst excision right arm x2   ESOPHAGOGASTRODUODENOSCOPY  02/28/2018   Small hiatal hernia, mild gastritis, biopsies negative for H. pylori and celiac   GANGLION CYST EXCISION Right 2007   Right wrist   HARDWARE REMOVAL Right 2020   Right ankle plate and screws removal   ORIF ANKLE FRACTURE Right 2019   Plates and screws placed (removed in 2020)   Family History  Problem Relation Age of Onset   Urticaria Mother    Asthma Mother    Depression Mother    Hypertension Mother    Miscarriages / [place] Mother    Colon polyps Mother    Eczema Brother    Arthritis Brother    Alcohol abuse Father    Heart attack Father    Allergic rhinitis Neg Hx    Colon cancer Neg Hx    Esophageal cancer Neg Hx    Stomach cancer Neg Hx    Social History   Social History Narrative   Marital status/children/pets: single   Education/employment:  masters degree. Employed as a Gaffer:      -smoke alarm in the home:No     - wears seatbelt: No     - Feels safe in their relationships: No    Allergies as of 07/12/2022       Reactions   Pseudoephedrine Other (See Comments)   Joint swelling        Medication List  Accurate as of July 12, 2022 10:58 AM. If you have any questions, ask your nurse or doctor.          DULoxetine 20 MG capsule Commonly known as: CYMBALTA Take 2 capsules (40 mg total) by mouth daily.   hydroxychloroquine 200 MG tablet Commonly known as: PLAQUENIL Take 400 mg by mouth daily.   Immune Globulin 10% 10G/183mL (10,000mg /186mL) Soln Generic drug: Immune Globulin 10% Inject into the vein.   ipratropium 0.03 % nasal spray Commonly known as: ATROVENT Place 1-2 sprays into both nostrils 2 (two) times daily as needed (nasal drainage).   Jencycla 0.35 MG tablet Generic drug: norethindrone Take 1 tablet by mouth daily.   levothyroxine 50 MCG tablet Commonly known as: SYNTHROID Take 0.5 tablets (25 mcg total) by mouth daily.       All past medical history, surgical history, allergies, family history, immunizations andmedications were updated in the EMR today and reviewed under the history and medication portions of their EMR.      ROS 14 pt review of systems performed and negative (unless mentioned in an HPI)  Objective: BP 124/86   Pulse 85   Temp 98.2 F (36.8 C)   Wt (!) 314 lb 12.8 oz (142.8 kg)   LMP 07/04/2022   SpO2 99%   BMI 47.87 kg/m  Physical Exam Vitals and nursing note reviewed.  Constitutional:      General: She is not in acute distress.    Appearance: Normal appearance. She is not ill-appearing, toxic-appearing or diaphoretic.  HENT:     Head: Normocephalic and atraumatic.  Eyes:     General: No scleral icterus.       Right eye: No discharge.        Left eye: No discharge.     Extraocular Movements: Extraocular movements intact.      Conjunctiva/sclera: Conjunctivae normal.     Pupils: Pupils are equal, round, and reactive to light.  Cardiovascular:     Rate and Rhythm: Normal rate and regular rhythm.  Pulmonary:     Effort: Pulmonary effort is normal. No respiratory distress.     Breath sounds: Normal breath sounds. No wheezing, rhonchi or rales.  Musculoskeletal:     Right lower leg: No edema.     Left lower leg: No edema.  Skin:    General: Skin is warm.     Findings: No rash.  Neurological:     Mental Status: She is alert and oriented to person, place, and time. Mental status is at baseline.     Motor: No weakness.     Gait: Gait normal.  Psychiatric:        Mood and Affect: Mood normal.        Behavior: Behavior normal.        Thought Content: Thought content normal.        Judgment: Judgment normal.    No results found.  Assessment/plan: Wanda Rideout is a 32 y.o. female present for routine chronic conditions Anxiety/panic Stable Continue Cymbalta 40 mg daily  Lupus (HCC)/CVID (common variable immunodeficiency) (HCC) Managed by rheum and allergy Likely contributing Chronic fatigue   Hypothyroidism: Levo 50 mcg Tsh, TPO collected.  Refills will be called in an appropriate dose of levothyroxine for her. Could consider cytomel Goal TSH 2.21 or less 32 year old female.  Fatigue, unspecified type Discussed different causes of fatigue today.  Fatigue could be secondary to her chronic conditions of lupus, CVID or new conditions of sleep apnea.  She did  have an elevated ESR and hilar lymphadenopathy in the past, will rule out sarcoid today with a's level. - TSH - Thyroid peroxidase antibody - Vitamin D (25 hydroxy) - B12 and Folate Panel - IBC + Ferritin - PTH, Intact and Calcium - CBC w/Diff - Angiotensin converting enzyme - Sedimentation rate - Ambulatory referral to Neurology  Daytime somnolence/BMI 47/snores Sleep apnea eval-high risk - Ambulatory referral to Neurology  Snores -  Ambulatory referral to Neurology   Return in about 24 weeks (around 12/27/2022) for Routine chronic condition follow-up.   Orders Placed This Encounter  Procedures   TSH   Thyroid peroxidase antibody   Vitamin D (25 hydroxy)   B12 and Folate Panel   IBC + Ferritin   PTH, Intact and Calcium   CBC w/Diff   Angiotensin converting enzyme   Sedimentation rate   Ambulatory referral to Neurology   Meds ordered this encounter  Medications   DULoxetine (CYMBALTA) 20 MG capsule    Sig: Take 2 capsules (40 mg total) by mouth daily.    Dispense:  180 capsule    Refill:  1   Referral Orders         Ambulatory referral to Neurology       Note is dictated utilizing voice recognition software. Although note has been proof read prior to signing, occasional typographical errors still can be missed. If any questions arise, please do not hesitate to call for verification.  Electronically signed by: Felix Pacini, DO [facility] [place]

## 2022-07-12 NOTE — Patient Instructions (Addendum)
Return in about 24 weeks (around 12/27/2022) for Routine chronic condition follow-up.        Great to see you today.  I have refilled the medication(s) we provide.   If labs were collected, we will inform you of lab results once received either by echart message or telephone call.   - echart message- for normal results that have been seen by the patient already.   - telephone call: abnormal results or if patient has not viewed results in their echart.

## 2022-07-13 ENCOUNTER — Telehealth: Payer: Self-pay | Admitting: Family Medicine

## 2022-07-13 ENCOUNTER — Ambulatory Visit (INDEPENDENT_AMBULATORY_CARE_PROVIDER_SITE_OTHER): Payer: BC Managed Care – PPO | Admitting: Allergy

## 2022-07-13 ENCOUNTER — Encounter: Payer: Self-pay | Admitting: Family Medicine

## 2022-07-13 ENCOUNTER — Encounter: Payer: Self-pay | Admitting: Allergy

## 2022-07-13 VITALS — BP 126/78 | HR 88 | Temp 97.8°F | Resp 18 | Ht 68.3 in | Wt 313.8 lb

## 2022-07-13 DIAGNOSIS — D839 Common variable immunodeficiency, unspecified: Secondary | ICD-10-CM

## 2022-07-13 DIAGNOSIS — J310 Chronic rhinitis: Secondary | ICD-10-CM

## 2022-07-13 DIAGNOSIS — M329 Systemic lupus erythematosus, unspecified: Secondary | ICD-10-CM

## 2022-07-13 DIAGNOSIS — E559 Vitamin D deficiency, unspecified: Secondary | ICD-10-CM | POA: Insufficient documentation

## 2022-07-13 DIAGNOSIS — E538 Deficiency of other specified B group vitamins: Secondary | ICD-10-CM | POA: Insufficient documentation

## 2022-07-13 MED ORDER — VITAMIN D (ERGOCALCIFEROL) 1.25 MG (50000 UNIT) PO CAPS: 50000.0000 [IU] | ORAL_CAPSULE | ORAL | 0 refills | Status: DC

## 2022-07-13 MED ORDER — FOLIC ACID 1 MG PO TABS: 1.0000 mg | ORAL_TABLET | Freq: Every day | ORAL | 1 refills | Status: DC

## 2022-07-13 NOTE — Assessment & Plan Note (Signed)
Past history - PND symptoms in the mornings. Patient had skin prick testing and bloodwork in 2020 which was negative to environmental allergens. Interestingly though she had positive testing beforehand and was on immunotherapy for 2 short months.  Interim history - asymptomatic with no meds. Monitor symptoms. Only use as needed:  May use over the counter antihistamines such as Zyrtec (cetirizine), Claritin (loratadine), Allegra (fexofenadine), or Xyzal (levocetirizine) daily as needed. Use Atrovent (ipratropium) 0.03% 1-2 sprays per nostril twice a day as needed for runny nose/drainage. Use Flonase (fluticasone) nasal spray 1 spray per nostril twice a day as needed for nasal congestion.

## 2022-07-13 NOTE — Telephone Encounter (Signed)
Please call patient Thyroid levels are normal. Some labs are still pending, but she has many labs returning abnormal. Iron levels are borderline.  With a level of iron around 50, patients will become symptomatic and feels tired and weak.  Her levels are 57.  I do not feel she needs to take an iron supplement at this time, but I do recommend she increase iron in her diet. Good plant sources of iron are lentils, chickpeas, beans, tofu, cashew nuts, pumpkin seeds, kale, dried apricots and figs, raisins, quinoa and fortified breakfast cereal.  Her vitamin D is extremely low at 22.  Her B12 is extremely low at 201.  Her folate is extremely low at 5.7  All of the above vitamins need supplemented.  All of the above will cause symptoms of which she is experiencing. -Please set her up for B12 injections ASAP.  B12 injections every 2 weeks x 6 doses.   -I have called in vitamin D high-dose once weekly supplement to start for 12 weeks.  We will then recheck a provider appointment to see what dose she should remain on long-term -I have called in a folate supplement for her to take daily.   recheck with provider appointment. 11-12 weeks. Will call her with the rest of the lab results once received   [facility] is [facility] in [place]

## 2022-07-13 NOTE — Patient Instructions (Addendum)
CVID Continue IV infusions every 4 weeks. Get bloodwork every 6 months - due in October.  Get before you are due for your infusion.  Keep track of infections.  Rhinitis: Monitor symptoms. Only use as needed:  May use over the counter antihistamines such as Zyrtec (cetirizine), Claritin (loratadine), Allegra (fexofenadine), or Xyzal (levocetirizine) daily as needed. Use Atrovent (ipratropium) 0.03% 1-2 sprays per nostril twice a day as needed for runny nose/drainage. Use Flonase (fluticasone) nasal spray 1 spray per nostril twice a day as needed for nasal congestion.   Follow up in 6 months or sooner if needed.

## 2022-07-13 NOTE — Telephone Encounter (Signed)
Results and recommendations given to pt. Pt scheduled for both appts

## 2022-07-13 NOTE — Progress Notes (Signed)
Follow Up Note  RE: Shirley Carr MRN: [id number] DOB: 11-Sep-1990 Date of Office Visit: 07/13/2022  Referring provider: Natalia Leatherwood, DO Primary care provider: Natalia Leatherwood, DO  Chief Complaint: Follow-up (NO issues)  History of Present Illness: I had the pleasure of seeing Shirley Carr for a follow up visit at the [facility] on 07/13/2022. She is a 32 y.o. female, who is being followed for CVID on IVIG and chronic rhinitis. Her previous allergy office visit was on 01/12/2022 with Dr. Selena Batten. Today is a regular follow up visit.  CVID No infections or antibiotics since the last OV. Gets IV infusions every 4 weeks at the infusions center and tolerating it well.  No issues with her breathing,  No fevers/chills. No lymphadenopathy, bruising/bleeding.   Chronic rhinitis Asymptomatic with no meds.   Assessment and Plan: [name] is a 32 y.o. female with: CVID (common variable immunodeficiency) (HCC) Past history - Diagnosed with CVID in 2020 and has been on Hizentra 13gm weekly (6 injection sites). Initially had some fatigue and headaches. Last injection on 06/22/2019. Pretreatment levels on 07/09/2018 IgG 459, IgA <40, IgM 28. History of frequent sinusitis, 1 pneumonia, ear infections, meningitis, foot infection requiring hospitalization. 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 History of ITP in [address], diagnosed with lupus and on Plaquenil. No history of blood clots. Covid-19 in 2022. Hyquvia caused localized irritation. On IVIG since March 2023. CT chest in 2023 showed stable axillary adenopathy b/l - most likely reactive and benign. 2024 normal spirometry.  Interim history - tolerating IVIG at infusion center with no issues. No infections/antibiotics.  Continue IV infusions every 4 weeks. Get bloodwork every 6 months - due in October.  Get before you are due for your infusion.  Keep track of infections. Get spirometry at next visit.  Chronic  rhinitis Past history - PND symptoms in the mornings. Patient had skin prick testing and bloodwork in 2020 which was negative to environmental allergens. Interestingly though she had positive testing beforehand and was on immunotherapy for 2 short months.  Interim history - asymptomatic with no meds. Monitor symptoms. Only use as needed:  May use over the counter antihistamines such as Zyrtec (cetirizine), Claritin (loratadine), Allegra (fexofenadine), or Xyzal (levocetirizine) daily as needed. Use Atrovent (ipratropium) 0.03% 1-2 sprays per nostril twice a day as needed for runny nose/drainage. Use Flonase (fluticasone) nasal spray 1 spray per nostril twice a day as needed for nasal congestion.   Return in about 6 months (around 01/13/2023).  No orders of the defined types were placed in this encounter.  Lab Orders         CBC with Differential/Platelet         Comprehensive metabolic panel         Sedimentation rate         IgG, IgA, IgM      Diagnostics: None.   Medication List:  Current Outpatient Medications  Medication Sig Dispense Refill   DULoxetine (CYMBALTA) 20 MG capsule Take 2 capsules (40 mg total) by mouth daily. 180 capsule 1   hydroxychloroquine (PLAQUENIL) 200 MG tablet Take 400 mg by mouth daily.     Immune Globulin 10% 10G/176mL (10,000mg /151mL) SOLN Inject into the vein.     ipratropium (ATROVENT) 0.03 % nasal spray Place 1-2 sprays into both nostrils 2 (two) times daily as needed (nasal drainage). 30 mL 5   JENCYCLA 0.35 MG tablet Take 1 tablet by mouth daily.  levothyroxine (SYNTHROID) 50 MCG tablet Take 0.5 tablets (25 mcg total) by mouth daily. 90 tablet 3   folic acid (FOLVITE) 1 MG tablet Take 1 tablet (1 mg total) by mouth daily. 90 tablet 1   Vitamin D, Ergocalciferol, (DRISDOL) 1.25 MG (50000 UNIT) CAPS capsule Take 1 capsule (50,000 Units total) by mouth every 7 (seven) days. 12 capsule 0   No current facility-administered medications for this visit.    Allergies: Allergies  Allergen Reactions   Pseudoephedrine Other (See Comments)    Joint swelling   I reviewed her past medical history, social history, family history, and environmental history and no significant changes have been reported from her previous visit.  Review of Systems  Constitutional:  Negative for appetite change, chills, fever and unexpected weight change.  HENT:  Negative for congestion, nosebleeds, postnasal drip and rhinorrhea.   Eyes:  Negative for itching.  Respiratory:  Negative for cough, chest tightness, shortness of breath and wheezing.   Cardiovascular:  Negative for chest pain.  Gastrointestinal:  Negative for abdominal pain.  Genitourinary:  Negative for difficulty urinating.  Skin:  Negative for rash.  Allergic/Immunologic: Negative for environmental allergies and food allergies.  Neurological:  Negative for headaches.    Objective: BP 126/78 (BP Location: Left Arm, Patient Position: Sitting, Cuff Size: Large)   Pulse 88   Temp 97.8 F (36.6 C) (Temporal)   Resp 18   Ht 5' 8.3" (1.735 m)   Wt (!) 313 lb 12 oz (142.3 kg)   LMP 07/04/2022   SpO2 97%   BMI 47.29 kg/m  Body mass index is 47.29 kg/m. Physical Exam Vitals and nursing note reviewed.  Constitutional:      Appearance: Normal appearance. She is well-developed. She is obese.  HENT:     Head: Normocephalic and atraumatic.     Right Ear: Tympanic membrane and external ear normal.     Left Ear: Tympanic membrane and external ear normal.     Nose: Nose normal.     Mouth/Throat:     Mouth: Mucous membranes are moist.     Pharynx: Oropharynx is clear.  Eyes:     Conjunctiva/sclera: Conjunctivae normal.  Cardiovascular:     Rate and Rhythm: Normal rate and regular rhythm.     Heart sounds: Normal heart sounds. No murmur heard.    No friction rub. No gallop.  Pulmonary:     Effort: Pulmonary effort is normal.     Breath sounds: Normal breath sounds. No wheezing, rhonchi or rales.   Abdominal:     Palpations: Abdomen is soft.  Musculoskeletal:     Cervical back: Neck supple.  Skin:    General: Skin is warm.     Findings: No bruising or rash.  Neurological:     Mental Status: She is alert and oriented to person, place, and time.  Psychiatric:        Mood and Affect: Mood normal.        Behavior: Behavior normal.   Previous notes and tests were reviewed. The plan was reviewed with the patient/family, and all questions/concerned were addressed.  It was my pleasure to see Shirley Carr today and participate in her care. Please feel free to contact me with any questions or concerns.  Sincerely,  Wyline Mood, DO Allergy & Immunology  [facility] office: [phone] [facility] office: [phone]

## 2022-07-13 NOTE — Assessment & Plan Note (Signed)
Past history - Diagnosed with CVID in 2020 and has been on Hizentra 13gm weekly (6 injection sites). Initially had some fatigue and headaches. Last injection on 06/22/2019. Pretreatment levels on 07/09/2018 IgG 459, IgA <40, IgM 28. History of frequent sinusitis, 1 pneumonia, ear infections, meningitis, foot infection requiring hospitalization. 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 History of ITP in [address], diagnosed with lupus and on Plaquenil. No history of blood clots. Covid-19 in 2022. Hyquvia caused localized irritation. On IVIG since March 2023. CT chest in 2023 showed stable axillary adenopathy b/l - most likely reactive and benign. 2024 normal spirometry.  Interim history - tolerating IVIG at infusion center with no issues. No infections/antibiotics.  Continue IV infusions every 4 weeks. Get bloodwork every 6 months - due in October.  Get before you are due for your infusion.  Keep track of infections. Get spirometry at next visit.

## 2022-07-14 LAB — PTH, INTACT AND CALCIUM
Calcium: 9.4 mg/dL (ref 8.6–10.2)
PTH: 48 pg/mL (ref 16–77)

## 2022-07-14 LAB — ANGIOTENSIN CONVERTING ENZYME: Angiotensin-Converting Enzyme: 60 U/L (ref 9–67)

## 2022-07-14 LAB — THYROID PEROXIDASE ANTIBODY: Thyroperoxidase Ab SerPl-aCnc: 25 IU/mL — ABNORMAL HIGH (ref <9)

## 2022-07-14 LAB — EXTRA SPECIMEN

## 2022-07-14 NOTE — Telephone Encounter (Signed)
Completed referral to request lupus center/doctor

## 2022-07-17 ENCOUNTER — Ambulatory Visit: Payer: BC Managed Care – PPO

## 2022-07-17 DIAGNOSIS — E538 Deficiency of other specified B group vitamins: Secondary | ICD-10-CM

## 2022-07-17 MED ORDER — CYANOCOBALAMIN 1000 MCG/ML IJ SOLN
1000.0000 ug | INTRAMUSCULAR | Status: AC
  Administered 2022-07-17 – 2023-01-04 (×6): 1000 ug via INTRAMUSCULAR

## 2022-07-17 NOTE — Progress Notes (Signed)
Pt here for monthly B12 injection per Dr. Claiborne Billings  B12 given IM, and pt tolerated injection well.  Next B12 injection scheduled for 2 weeks from today.

## 2022-07-31 ENCOUNTER — Ambulatory Visit: Payer: BC Managed Care – PPO

## 2022-07-31 DIAGNOSIS — E538 Deficiency of other specified B group vitamins: Secondary | ICD-10-CM | POA: Diagnosis not present

## 2022-07-31 NOTE — Progress Notes (Signed)
Pt here for 2/6 biweekly B12 injection per Dr.Kuneff. (B12 injections every 2 weeks x 6 doses )  B12 given IM right deltoid, and pt tolerated injection well.  Next B12 injection scheduled for 2 weeks, 08/14/22.

## 2022-08-01 DIAGNOSIS — M329 Systemic lupus erythematosus, unspecified: Secondary | ICD-10-CM | POA: Diagnosis not present

## 2022-08-01 DIAGNOSIS — Z862 Personal history of diseases of the blood and blood-forming organs and certain disorders involving the immune mechanism: Secondary | ICD-10-CM | POA: Diagnosis not present

## 2022-08-01 DIAGNOSIS — Z79899 Other long term (current) drug therapy: Secondary | ICD-10-CM | POA: Diagnosis not present

## 2022-08-01 DIAGNOSIS — D839 Common variable immunodeficiency, unspecified: Secondary | ICD-10-CM | POA: Diagnosis not present

## 2022-08-04 DIAGNOSIS — D839 Common variable immunodeficiency, unspecified: Secondary | ICD-10-CM | POA: Diagnosis not present

## 2022-08-07 ENCOUNTER — Ambulatory Visit: Payer: BC Managed Care – PPO | Admitting: Family Medicine

## 2022-08-14 ENCOUNTER — Ambulatory Visit (INDEPENDENT_AMBULATORY_CARE_PROVIDER_SITE_OTHER): Payer: BC Managed Care – PPO

## 2022-08-14 DIAGNOSIS — E538 Deficiency of other specified B group vitamins: Secondary | ICD-10-CM

## 2022-08-14 NOTE — Progress Notes (Signed)
Pt here for 3/6 biweekly B12 injection per Dr.Kuneff (B12 injections every 2 weeks x 6 doses )   B12 given IM left deltoid, and pt tolerated injection well.  Next B12 injection scheduled for 08/28/22

## 2022-08-17 ENCOUNTER — Encounter (INDEPENDENT_AMBULATORY_CARE_PROVIDER_SITE_OTHER): Payer: Self-pay

## 2022-08-28 ENCOUNTER — Ambulatory Visit (INDEPENDENT_AMBULATORY_CARE_PROVIDER_SITE_OTHER): Payer: BC Managed Care – PPO

## 2022-08-28 ENCOUNTER — Ambulatory Visit (INDEPENDENT_AMBULATORY_CARE_PROVIDER_SITE_OTHER): Payer: BC Managed Care – PPO | Admitting: Neurology

## 2022-08-28 ENCOUNTER — Encounter: Payer: Self-pay | Admitting: Neurology

## 2022-08-28 VITALS — BP 137/90 | HR 93 | Ht 67.0 in | Wt 313.0 lb

## 2022-08-28 DIAGNOSIS — G4711 Idiopathic hypersomnia with long sleep time: Secondary | ICD-10-CM | POA: Insufficient documentation

## 2022-08-28 DIAGNOSIS — G053 Encephalitis and encephalomyelitis in diseases classified elsewhere: Secondary | ICD-10-CM

## 2022-08-28 DIAGNOSIS — E559 Vitamin D deficiency, unspecified: Secondary | ICD-10-CM

## 2022-08-28 DIAGNOSIS — D839 Common variable immunodeficiency, unspecified: Secondary | ICD-10-CM

## 2022-08-28 DIAGNOSIS — Z862 Personal history of diseases of the blood and blood-forming organs and certain disorders involving the immune mechanism: Secondary | ICD-10-CM | POA: Insufficient documentation

## 2022-08-28 DIAGNOSIS — D6862 Lupus anticoagulant syndrome: Secondary | ICD-10-CM | POA: Diagnosis not present

## 2022-08-28 DIAGNOSIS — E538 Deficiency of other specified B group vitamins: Secondary | ICD-10-CM | POA: Diagnosis not present

## 2022-08-28 DIAGNOSIS — M3219 Other organ or system involvement in systemic lupus erythematosus: Secondary | ICD-10-CM

## 2022-08-28 DIAGNOSIS — E669 Obesity, unspecified: Secondary | ICD-10-CM

## 2022-08-28 NOTE — Patient Instructions (Signed)
Screening for Sleep Apnea  Sleep apnea is a condition in which breathing pauses or becomes shallow during sleep. Sleep apnea screening is a test to determine if you are at risk for sleep apnea. The test includes a series of questions. It will only takes a few minutes. Your health care provider may ask you to have this test in preparation for surgery or as part of a physical exam. What are the symptoms of sleep apnea? Common symptoms of sleep apnea include: Snoring. Waking up often at night. Daytime sleepiness. Pauses in breathing. Choking or gasping during sleep. Irritability. Forgetfulness. Trouble thinking clearly. Depression. Personality changes. Most people with sleep apnea do not know that they have it. What are the advantages of sleep apnea screening? Getting screened for sleep apnea can help: Ensure your safety. It is important for your health care providers to know whether or not you have sleep apnea, especially if you are having surgery or have other long-term (chronic) health conditions. Improve your health and allow you to get a better night's rest. Restful sleep can help you: Have more energy. Lose weight. Improve high blood pressure. Improve diabetes management. Prevent stroke. Prevent car accidents. What happens during the screening? Screening usually includes being asked a list of questions about your sleep quality. Some questions you may be asked include: Do you snore? Is your sleep restless? Do you have daytime sleepiness? Has a partner or spouse told you that you stop breathing during sleep? Have you had trouble concentrating or memory loss? What is your age? What is your neck circumference? To measure your neck, keep your back straight and gently wrap the tape measure around your neck. Put the tape measure at the middle of your neck, between your chin and collarbone. What is your sex assigned at birth? Do you have or are you being treated for high blood  pressure? If your screening test is positive, you are at risk for the condition. Further testing may be needed to confirm a diagnosis of sleep apnea. Where to find more information You can find screening tools online or at your health care clinic. For more information about sleep apnea screening and healthy sleep, visit these websites: Centers for Disease Control and Prevention: www.cdc.gov American Sleep Apnea Association: www.sleepapnea.org Contact a health care provider if: You think that you may have sleep apnea. Summary Sleep apnea screening can help determine if you are at risk for sleep apnea. It is important for your health care providers to know whether or not you have sleep apnea, especially if you are having surgery or have other chronic health conditions. You may be asked to take a screening test for sleep apnea in preparation for surgery or as part of a physical exam. This information is not intended to replace advice given to you by your health care provider. Make sure you discuss any questions you have with your health care provider. Document Revised: 11/28/2019 Document Reviewed: 11/28/2019 Elsevier Patient Education  2024 Elsevier Inc.  
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
## 2022-08-28 NOTE — Progress Notes (Signed)
Pt here for monthly B12 injection per Dr Claiborne Billings  B12 given IM, and pt tolerated injection well.  Next B12 injection scheduled for 14 days.

## 2022-08-29 ENCOUNTER — Telehealth: Payer: Self-pay | Admitting: Neurology

## 2022-08-29 NOTE — Telephone Encounter (Signed)
HST- [employer] pending faxed notes

## 2022-08-29 NOTE — Telephone Encounter (Signed)
HST- [employer] no auth req via fax form.

## 2022-09-01 DIAGNOSIS — D839 Common variable immunodeficiency, unspecified: Secondary | ICD-10-CM | POA: Diagnosis not present

## 2022-09-12 ENCOUNTER — Ambulatory Visit: Payer: BC Managed Care – PPO | Admitting: Neurology

## 2022-09-12 DIAGNOSIS — E669 Obesity, unspecified: Secondary | ICD-10-CM

## 2022-09-12 DIAGNOSIS — D6862 Lupus anticoagulant syndrome: Secondary | ICD-10-CM

## 2022-09-12 DIAGNOSIS — Z862 Personal history of diseases of the blood and blood-forming organs and certain disorders involving the immune mechanism: Secondary | ICD-10-CM

## 2022-09-12 DIAGNOSIS — G4710 Hypersomnia, unspecified: Secondary | ICD-10-CM

## 2022-09-12 DIAGNOSIS — G4711 Idiopathic hypersomnia with long sleep time: Secondary | ICD-10-CM

## 2022-09-12 DIAGNOSIS — G4730 Sleep apnea, unspecified: Secondary | ICD-10-CM

## 2022-09-14 ENCOUNTER — Encounter: Payer: Self-pay | Admitting: Family Medicine

## 2022-09-14 ENCOUNTER — Ambulatory Visit (INDEPENDENT_AMBULATORY_CARE_PROVIDER_SITE_OTHER): Payer: BC Managed Care – PPO | Admitting: Family Medicine

## 2022-09-14 VITALS — BP 116/78 | HR 91 | Temp 98.1°F | Wt 312.0 lb

## 2022-09-14 DIAGNOSIS — E538 Deficiency of other specified B group vitamins: Secondary | ICD-10-CM

## 2022-09-14 DIAGNOSIS — E559 Vitamin D deficiency, unspecified: Secondary | ICD-10-CM | POA: Diagnosis not present

## 2022-09-14 LAB — VITAMIN D 25 HYDROXY (VIT D DEFICIENCY, FRACTURES): VITD: 40.6 ng/mL (ref 30.00–100.00)

## 2022-09-14 LAB — B12 AND FOLATE PANEL
Folate: 17.2 ng/mL (ref >5.9)
Vitamin B-12: 533 pg/mL (ref 211–911)

## 2022-09-14 NOTE — Patient Instructions (Addendum)

## 2022-09-14 NOTE — Progress Notes (Signed)
Patient ID: Shirley Carr, female  DOB: February 23, 1990, 32 y.o.   MRN: [id number] Patient Care Team    Relationship Specialty Notifications Start End  Natalia Leatherwood, DO PCP - General Family Medicine  08/28/22   Casimer Lanius, MD Consulting Physician Rheumatology  02/18/20   Ellamae Sia, DO Consulting Physician Allergy  02/18/20   Zelphia Cairo, MD Consulting Physician Obstetrics and Gynecology  02/18/20   Grant Fontana, MD Referring Physician Ophthalmology  02/18/20   Armbruster, Willaim Rayas, MD Consulting Physician Gastroenterology  02/18/20   Charlott Holler, MD Consulting Physician Pulmonary Disease  12/08/20     Chief Complaint  Patient presents with   B12 Injection   b12 deficiency    Subjective:  Shirley Carr is a 32 y.o. female present for follow up on vitamin deficiencies and fatigue. She was seen for fatigue complaints 3 mos ago and was found to be deficient in b12, folate and vit.d. She was started on b12 injections q 2 weeks (due today), vitd 50k and folate supplements. Pt reports compliance with supplements and she is feeling better. Iron levels borderline and she was encouraged to add more iron sources to her diet.  She had her appt with Dr. Zipporah Plants and home sleep study is being completed this week.      09/14/2022    8:59 AM 07/12/2022   10:25 AM 04/22/2021    8:16 AM 12/08/2020    9:25 AM 11/04/2020   10:02 AM  Depression screen PHQ 2/9  Decreased Interest 0 2 1 1 1   Down, Depressed, Hopeless 0 0 1 1 1   PHQ - 2 Score 0 2 2 2 2   Altered sleeping  3 2 1 2   Tired, decreased energy  3 3 2 3   Change in appetite  0 1 1 3   Feeling bad or failure about yourself   0 1 1 2   Trouble concentrating  3 3 1 1   Moving slowly or fidgety/restless  0 1 0 0  Suicidal thoughts  0 0 0 0  PHQ-9 Score  11 13 8 13       07/12/2022   10:25 AM 04/22/2021    8:16 AM 12/08/2020    9:27 AM 11/04/2020   10:02 AM  GAD 7 : Generalized Anxiety Score  Nervous, Anxious, on Edge 0 0 2 3   Control/stop worrying 0 0 2 3  Worry too much - different things 0 0 1 3  Trouble relaxing 0 0 1 2  Restless 0 0 0 1  Easily annoyed or irritable 0 1 2 2   Afraid - awful might happen 0 0 1 3  Total GAD 7 Score 0 1 9 17           09/14/2022    8:59 AM 07/12/2022   10:25 AM 04/20/2022   10:40 AM 02/18/2020   12:58 PM  Fall Risk   Falls in the past year? 0 0 0 0  Number falls in past yr: 0 0 0 0  Injury with Fall? 0 0 0 0  Risk for fall due to :  No Fall Risks    Follow up Falls evaluation completed Falls evaluation completed Falls evaluation completed Falls evaluation completed   Immunization History  Administered Date(s) Administered   HPV Quadrivalent 07/23/2006, 10/04/2006   Hepatitis B, PED/ADOLESCENT [birth year]1991, 01/14/1992, 06/11/1992   Hpv-Unspecified 07/23/2006, 10/04/2006, 01/31/2007   IPV 04/13/1992, 06/11/1992, 06/15/1993, 02/19/1996   Influenza,inj,quad, With Preservative 10/04/2015   MMR 03/14/1993, 12/11/1996  Meningococcal Conjugate 07/23/2006, 07/23/2006   Moderna Sars-Covid-2 Vaccination 01/09/2020, 02/06/2020   Tdap 02/13/1992, 04/13/1992, 06/11/1992, 06/15/1993, 02/19/1996, 06/23/2006, 07/23/2006, 01/03/2016, 08/29/2016, 09/09/2016   Past Medical History:  Diagnosis Date   Allergy    Anxiety    Arthritis    Blood in stool    Chicken pox    Chronic ITP (idiopathic thrombocytopenia) (HCC)    Colon polyps 02/28/2018   Adenoma-repeat colonoscopy 2 years   COVID-19 07/29/2020   CVID (common variable immunodeficiency) (HCC)    GERD (gastroesophageal reflux disease)    H/O stem cell transplant (HCC) 2014   Hiatal hernia 02/28/2018   Small hiatal hernia on endoscopy, gastritis.  Biopsies negative for H. pylori and celiac   History of asthma 06/26/2019   Lupus (HCC)    Lupus encephalitis (HCC) 2013   Hospitalized   Osteomyelitis (HCC) 2018   Hospitalized foot/ankle   Polyp of colon 02/27/2018   Raynaud's disease    Recurrent upper respiratory infection  (URI)    UTI (urinary tract infection)    Allergies  Allergen Reactions   Pseudoephedrine Other (See Comments)    Joint swelling   Past Surgical History:  Procedure Laterality Date   COLONOSCOPY  02/28/2018   Adenoma-repeat colonoscopy 2 years   CYST EXCISION Right 2020   Benign cyst excision right arm x2   ESOPHAGOGASTRODUODENOSCOPY  02/28/2018   Small hiatal hernia, mild gastritis, biopsies negative for H. pylori and celiac   GANGLION CYST EXCISION Right 2007   Right wrist   HARDWARE REMOVAL Right 2020   Right ankle plate and screws removal   ORIF ANKLE FRACTURE Right 2019   Plates and screws placed (removed in 2020)   Family History  Problem Relation Age of Onset   Urticaria Mother    Asthma Mother    Depression Mother    Hypertension Mother    Miscarriages / [place] Mother    Colon polyps Mother    Eczema Brother    Arthritis Brother    Alcohol abuse Father    Heart attack Father    Allergic rhinitis Neg Hx    Colon cancer Neg Hx    Esophageal cancer Neg Hx    Stomach cancer Neg Hx    Social History   Social History Narrative   Marital status/children/pets: single   Education/employment: masters degree. Employed as a Gaffer:      -smoke alarm in the home:No     - wears seatbelt: No     - Feels safe in their relationships: No    Allergies as of 09/14/2022       Reactions   Pseudoephedrine Other (See Comments)   Joint swelling        Medication List        Accurate as of September 14, 2022  9:04 AM. If you have any questions, ask your nurse or doctor.          DULoxetine 20 MG capsule Commonly known as: CYMBALTA Take 2 capsules (40 mg total) by mouth daily.   folic acid 1 MG tablet Commonly known as: FOLVITE Take 1 tablet (1 mg total) by mouth daily.   hydroxychloroquine 200 MG tablet Commonly known as: PLAQUENIL Take 400 mg by mouth daily.   Immune Globulin 10% 10 GM/100ML Soln Generic drug: Immune  Globulin 10% Inject into the vein.   Jencycla 0.35 MG tablet Generic drug: norethindrone Take 1 tablet by mouth daily.   levothyroxine 50 MCG tablet Commonly  known as: SYNTHROID Take 0.5 tablets (25 mcg total) by mouth daily.   Vitamin D (Ergocalciferol) 1.25 MG (50000 UNIT) Caps capsule Commonly known as: DRISDOL Take 1 capsule (50,000 Units total) by mouth every 7 (seven) days.       All past medical history, surgical history, allergies, family history, immunizations andmedications were updated in the EMR today and reviewed under the history and medication portions of their EMR.      ROS 14 pt review of systems performed and negative (unless mentioned in an HPI)  Objective: BP 116/78   Pulse 91   Temp 98.1 F (36.7 C)   Wt (!) 312 lb (141.5 kg)   LMP 09/11/2022   SpO2 96%   BMI 48.87 kg/m  Physical Exam Vitals and nursing note reviewed.  Constitutional:      General: She is not in acute distress.    Appearance: Normal appearance. She is normal weight. She is not ill-appearing or toxic-appearing.  HENT:     Head: Normocephalic and atraumatic.  Eyes:     General: No scleral icterus.       Right eye: No discharge.        Left eye: No discharge.     Extraocular Movements: Extraocular movements intact.     Conjunctiva/sclera: Conjunctivae normal.     Pupils: Pupils are equal, round, and reactive to light.  Skin:    Findings: No rash.  Neurological:     Mental Status: She is alert and oriented to person, place, and time. Mental status is at baseline.     Motor: No weakness.     Coordination: Coordination normal.     Gait: Gait normal.  Psychiatric:        Mood and Affect: Mood normal.        Behavior: Behavior normal.        Thought Content: Thought content normal.        Judgment: Judgment normal.    No results found.  Assessment/plan: Shirley Carr is a 32 y.o. female present for  Fatigue/b12/folate/vitd -deficiency Discussed different causes of  fatigue today.  Fatigue could be secondary to her chronic conditions of lupus, CVID or new conditions of sleep apnea.  She did have an elevated ESR and hilar lymphadenopathy in the past, will rule out sarcoid today with a's level. - Vitamin D (25 hydroxy) - B12 and Folate Panel - b12 inj provided today - will advise her on dosage of supplements long term after we receive results from labs collected today. Refills will be provided at that time if required.   Daytime somnolence/BMI 47/snores Sleep apnea eval-high risk - Ambulatory referral to Neurology placed last visit and she has had consultation and a home sleep study arranged this week.    Return if symptoms worsen or fail to improve.   Orders Placed This Encounter  Procedures   B12 and Folate Panel   Vitamin D (25 hydroxy)   Homocysteine   No orders of the defined types were placed in this encounter.  Referral Orders  No referral(s) requested today    Note is dictated utilizing voice recognition software. Although note has been proof read prior to signing, occasional typographical errors still can be missed. If any questions arise, please do not hesitate to call for verification.  Electronically signed by: Felix Pacini, DO [facility] [place]

## 2022-09-15 ENCOUNTER — Telehealth: Payer: Self-pay | Admitting: Family Medicine

## 2022-09-15 ENCOUNTER — Encounter: Payer: Self-pay | Admitting: Family Medicine

## 2022-09-15 LAB — HOMOCYSTEINE: Homocysteine: 8.9 umol/L (ref <10.4)

## 2022-09-15 MED ORDER — CYANOCOBALAMIN 1000 MCG/ML IJ SOLN: 1000.0000 ug | INTRAMUSCULAR | 11 refills | Status: DC

## 2022-09-15 MED ORDER — "SYRINGE/NEEDLE (DISP) 25G X 1"" 3 ML MISC": 11 refills | Status: DC

## 2022-09-15 NOTE — Addendum Note (Signed)
Addended by: Filomena Jungling on: 09/15/2022 03:21 PM   Modules accepted: Orders

## 2022-09-15 NOTE — Telephone Encounter (Signed)
Spoke with patient regarding results/recommendations.  

## 2022-09-15 NOTE — Telephone Encounter (Signed)
Call patient: homocysteine levels are normal currently at 8.9.   Vitamin D levels are now normal.  She noted longer needs the prescribed medication, but she needs to start an over-the-counter vitamin D 2000 units daily-this needs to be taken indefinitely to maintain the levels of vitamin D. Your folate levels are now excellent, continue supplementation for the full 90 days and then can discontinue it as long as she takes a multivitamin that has folic acid/folate present. Your B12 is better at 533.   -Options to maintain B12 levels are the following:           -She can continue every 2-week B12 injections by nurse visit or if she can perform self injections at home every 2 weeks if she is comfortable with this.          -Or-   she could have B12 injections every 4 weeks by nurse visit and start the over-the-counter B12 1000 mcg sublingual solution daily.   Please change orders and medication management tab to her preferences and if she desires home injections please send those to the pharmacy for every 2-week injections-with needles

## 2022-09-18 MED ORDER — CYANOCOBALAMIN 1000 MCG/ML IJ SOLN: 1000.0000 ug | INTRAMUSCULAR | 11 refills | Status: DC
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
## 2022-09-29 ENCOUNTER — Telehealth: Payer: Self-pay | Admitting: Neurology

## 2022-09-29 DIAGNOSIS — D839 Common variable immunodeficiency, unspecified: Secondary | ICD-10-CM | POA: Diagnosis not present

## 2022-09-29 DIAGNOSIS — G4730 Sleep apnea, unspecified: Secondary | ICD-10-CM | POA: Insufficient documentation

## 2022-09-29 DIAGNOSIS — G4733 Obstructive sleep apnea (adult) (pediatric): Secondary | ICD-10-CM | POA: Insufficient documentation

## 2022-09-29 NOTE — Telephone Encounter (Signed)
-----   Message from Shelton Dohmeier sent at 09/29/2022 10:52 AM EDT ----- This HST confirms the presence of moderate sleep apnea, according to the device algorithm this is obstructive sleep apnea yet the non-REM sleep dominance speaks for a central or at least complex origin of apnea. This sleep apnea was also dependent on sleep position- with the higher AHI seen in supine. I wrote for an auto-CPAP device, RV in 30-60 days on therapy, machine and all attachments are to be brought to the appointment .
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
## 2022-09-29 NOTE — Telephone Encounter (Signed)
Called the patient a couple of times, I could hear her, she couldn't hear me. Called back and went to VM. Left a message advising must have had phone difficulties. Informed the pt she could call back or check mychart message.

## 2022-10-04 ENCOUNTER — Encounter: Payer: Self-pay | Admitting: Neurology

## 2022-10-10 NOTE — Telephone Encounter (Signed)
Ok great, I will go ahead and send those orders over to [facility] for you. They will  start the process and be in touch with you within a week. If you have not heard from [facility] within a week, their number is [phone], and you can call and reach out to them.

## 2022-10-26 DIAGNOSIS — G4733 Obstructive sleep apnea (adult) (pediatric): Secondary | ICD-10-CM | POA: Diagnosis not present

## 2022-10-27 DIAGNOSIS — D839 Common variable immunodeficiency, unspecified: Secondary | ICD-10-CM | POA: Diagnosis not present

## 2022-10-28 LAB — SEDIMENTATION RATE: Sed Rate: 21 mm/h (ref 0–32)

## 2022-10-28 LAB — CBC WITH DIFFERENTIAL/PLATELET
Basophils Absolute: 0 10*3/uL (ref 0.0–0.2)
Basos: 1 %
EOS (ABSOLUTE): 0.1 10*3/uL (ref 0.0–0.4)
Eos: 1 %
Hematocrit: 43.1 % (ref 34.0–46.6)
Hemoglobin: 14.2 g/dL (ref 11.1–15.9)
Immature Grans (Abs): 0 10*3/uL (ref 0.0–0.1)
Immature Granulocytes: 0 %
Lymphocytes Absolute: 1.6 10*3/uL (ref 0.7–3.1)
Lymphs: 25 %
MCH: 28.1 pg (ref 26.6–33.0)
MCHC: 32.9 g/dL (ref 31.5–35.7)
MCV: 85 fL (ref 79–97)
Monocytes Absolute: 0.5 10*3/uL (ref 0.1–0.9)
Monocytes: 7 %
Neutrophils Absolute: 4.1 10*3/uL (ref 1.4–7.0)
Neutrophils: 66 %
Platelets: 251 10*3/uL (ref 150–450)
RBC: 5.06 x10E6/uL (ref 3.77–5.28)
RDW: 13 % (ref 11.7–15.4)
WBC: 6.3 10*3/uL (ref 3.4–10.8)

## 2022-10-28 LAB — COMPREHENSIVE METABOLIC PANEL
ALT: 13 [IU]/L (ref 0–32)
AST: 21 [IU]/L (ref 0–40)
Albumin: 4.3 g/dL (ref 4.0–5.0)
Alkaline Phosphatase: 77 [IU]/L (ref 44–121)
BUN/Creatinine Ratio: 12 (ref 9–23)
BUN: 11 mg/dL (ref 6–20)
Bilirubin Total: 0.4 mg/dL (ref 0.0–1.2)
CO2: 21 mmol/L (ref 20–29)
Calcium: 9.2 mg/dL (ref 8.7–10.2)
Chloride: 104 mmol/L (ref 96–106)
Creatinine, Ser: 0.89 mg/dL (ref 0.57–1.00)
Globulin, Total: 2.3 g/dL (ref 1.5–4.5)
Glucose: 84 mg/dL (ref 70–99)
Potassium: 4.2 mmol/L (ref 3.5–5.2)
Sodium: 139 mmol/L (ref 134–144)
Total Protein: 6.6 g/dL (ref 6.0–8.5)
eGFR: 89 mL/min/{1.73_m2} (ref >59)

## 2022-10-28 LAB — IGG, IGA, IGM
IgA/Immunoglobulin A, Serum: 20 mg/dL — ABNORMAL LOW (ref 87–352)
IgG (Immunoglobin G), Serum: 1036 mg/dL (ref 586–1602)
IgM (Immunoglobulin M), Srm: 20 mg/dL — ABNORMAL LOW (ref 26–217)

## 2022-11-01 DIAGNOSIS — R5383 Other fatigue: Secondary | ICD-10-CM | POA: Diagnosis not present

## 2022-11-01 DIAGNOSIS — Z1321 Encounter for screening for nutritional disorder: Secondary | ICD-10-CM | POA: Diagnosis not present

## 2022-11-01 DIAGNOSIS — R7309 Other abnormal glucose: Secondary | ICD-10-CM | POA: Diagnosis not present

## 2022-11-14 ENCOUNTER — Telehealth: Payer: Self-pay | Admitting: Neurology

## 2022-11-14 NOTE — Telephone Encounter (Signed)
:  Called pt and was unable to  LVM,  stating that she is needing to schedule her Initial Cpap visit.

## 2022-11-23 ENCOUNTER — Telehealth: Payer: Self-pay | Admitting: Neurology

## 2022-11-23 NOTE — Telephone Encounter (Signed)
At 2:36 pt left a vm asking to be called to schedule her initial CPAP f/u.  Pt was called, she has been scheduled for 12/02, she is aware to bring her CPAP and power cord

## 2022-11-24 DIAGNOSIS — D839 Common variable immunodeficiency, unspecified: Secondary | ICD-10-CM | POA: Diagnosis not present

## 2022-11-26 DIAGNOSIS — G4733 Obstructive sleep apnea (adult) (pediatric): Secondary | ICD-10-CM | POA: Diagnosis not present

## 2022-12-04 ENCOUNTER — Ambulatory Visit (INDEPENDENT_AMBULATORY_CARE_PROVIDER_SITE_OTHER): Payer: BC Managed Care – PPO | Admitting: Family Medicine

## 2022-12-04 ENCOUNTER — Encounter: Payer: Self-pay | Admitting: Family Medicine

## 2022-12-04 VITALS — BP 130/88 | HR 87 | Ht 67.0 in | Wt 324.0 lb

## 2022-12-04 DIAGNOSIS — G4733 Obstructive sleep apnea (adult) (pediatric): Secondary | ICD-10-CM

## 2022-12-04 NOTE — Patient Instructions (Signed)
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
## 2022-12-21 ENCOUNTER — Ambulatory Visit (INDEPENDENT_AMBULATORY_CARE_PROVIDER_SITE_OTHER): Payer: BC Managed Care – PPO

## 2022-12-21 ENCOUNTER — Other Ambulatory Visit: Payer: Self-pay | Admitting: Family Medicine

## 2022-12-21 DIAGNOSIS — E538 Deficiency of other specified B group vitamins: Secondary | ICD-10-CM | POA: Diagnosis not present

## 2022-12-21 MED ORDER — CYANOCOBALAMIN 1000 MCG/ML IJ SOLN
1000.0000 ug | Freq: Once | INTRAMUSCULAR | Status: AC
Start: 2022-12-21 — End: 2022-12-21
  Administered 2022-12-21: 1000 ug via INTRAMUSCULAR

## 2022-12-21 NOTE — Progress Notes (Signed)
Pt here for monthly B12 injection per Dr Claiborne Billings   B12 given IM, and pt tolerated injection well.   Pt was doing injections herself at home; [facility] has a shortage on syringes.

## 2022-12-22 DIAGNOSIS — D839 Common variable immunodeficiency, unspecified: Secondary | ICD-10-CM | POA: Diagnosis not present

## 2022-12-25 NOTE — Telephone Encounter (Signed)
Error

## 2022-12-26 DIAGNOSIS — G4733 Obstructive sleep apnea (adult) (pediatric): Secondary | ICD-10-CM | POA: Diagnosis not present

## 2023-01-04 ENCOUNTER — Ambulatory Visit: Payer: BC Managed Care – PPO

## 2023-01-04 DIAGNOSIS — E538 Deficiency of other specified B group vitamins: Secondary | ICD-10-CM | POA: Diagnosis not present

## 2023-01-04 NOTE — Progress Notes (Signed)
Pt here for monthly B12 injection per Dr Claiborne Billings  B12 given IM, and pt tolerated injection well.  Next B12 injection scheduled for 14 days.

## 2023-01-10 ENCOUNTER — Ambulatory Visit: Payer: BC Managed Care – PPO | Admitting: Family Medicine

## 2023-01-11 ENCOUNTER — Ambulatory Visit: Payer: BC Managed Care – PPO | Admitting: Allergy

## 2023-01-18 ENCOUNTER — Ambulatory Visit: Payer: BC Managed Care – PPO

## 2023-01-19 DIAGNOSIS — D839 Common variable immunodeficiency, unspecified: Secondary | ICD-10-CM | POA: Diagnosis not present

## 2023-01-26 DIAGNOSIS — G4733 Obstructive sleep apnea (adult) (pediatric): Secondary | ICD-10-CM | POA: Diagnosis not present

## 2023-01-30 DIAGNOSIS — D839 Common variable immunodeficiency, unspecified: Secondary | ICD-10-CM | POA: Diagnosis not present

## 2023-01-30 DIAGNOSIS — M329 Systemic lupus erythematosus, unspecified: Secondary | ICD-10-CM | POA: Diagnosis not present

## 2023-01-30 DIAGNOSIS — Z79899 Other long term (current) drug therapy: Secondary | ICD-10-CM | POA: Diagnosis not present

## 2023-02-05 ENCOUNTER — Ambulatory Visit: Payer: BC Managed Care – PPO | Admitting: Family Medicine

## 2023-02-05 ENCOUNTER — Encounter: Payer: Self-pay | Admitting: Family Medicine

## 2023-02-05 VITALS — BP 130/88 | HR 88 | Temp 98.1°F | Wt 328.4 lb

## 2023-02-05 DIAGNOSIS — M3219 Other organ or system involvement in systemic lupus erythematosus: Secondary | ICD-10-CM

## 2023-02-05 DIAGNOSIS — M1990 Unspecified osteoarthritis, unspecified site: Secondary | ICD-10-CM

## 2023-02-05 DIAGNOSIS — D839 Common variable immunodeficiency, unspecified: Secondary | ICD-10-CM | POA: Diagnosis not present

## 2023-02-05 DIAGNOSIS — F419 Anxiety disorder, unspecified: Secondary | ICD-10-CM | POA: Diagnosis not present

## 2023-02-05 DIAGNOSIS — Z23 Encounter for immunization: Secondary | ICD-10-CM

## 2023-02-05 DIAGNOSIS — E063 Autoimmune thyroiditis: Secondary | ICD-10-CM

## 2023-02-05 DIAGNOSIS — F410 Panic disorder [episodic paroxysmal anxiety] without agoraphobia: Secondary | ICD-10-CM

## 2023-02-05 MED ORDER — DULOXETINE HCL 20 MG PO CPEP: 40.0000 mg | ORAL_CAPSULE | Freq: Every day | ORAL | 0 refills | Status: DC

## 2023-02-05 MED ORDER — LEVOTHYROXINE SODIUM 50 MCG PO TABS: 25.0000 ug | ORAL_TABLET | Freq: Every day | ORAL | 3 refills | Status: DC

## 2023-02-05 NOTE — Progress Notes (Signed)
 Follow Up Note  RE: Shirley Carr MRN: [id number] DOB: 05-12-1990 Date of Office Visit: 02/06/2023  Referring provider: Catherine Charlies LABOR, DO Primary care provider: Catherine Charlies LABOR, DO  Chief Complaint: follow up  History of Present Illness: I had the pleasure of seeing Shirley Carr for a follow up visit at the [facility] on 02/06/2023. She is a 33 y.o. female, who is being followed for CVID on IVIG, chronic rhinitis. Her previous allergy office visit was on 07/13/2022 with Dr. Luke. Today is a regular follow up visit.  Discussed the use of AI scribe software for clinical note transcription with the patient, who gave verbal consent to proceed.  She continues to receive IVIG infusions every four weeks without any issues, including good vein access and no post-infusion fatigue. She has been getting less sick since starting the infusions.  She has recently started using a CPAP machine following a sleep apnea study and reports significant improvement in her rest. No breathing difficulties, lumps, bumps, bruising, or bleeding. Appetite and bowel movements are normal.  She continues to work from home and has not experienced any recent illnesses or required antibiotics. Her current medications include thyroid  medication, birth control, Plaquenil for lupus, Cymbalta , and B12 injections.   Denies any rhinitis symptoms.     Assessment and Plan: Shirley Carr is a 33 y.o. female with: CVID (common variable immunodeficiency) (HCC) Past history - Diagnosed with CVID in 2020 and has been on Hizentra  13gm weekly (6 injection sites). Initially had some fatigue and headaches. Last injection on 06/22/2019. Pretreatment levels on 07/09/2018 IgG 459, IgA <40, IgM 28. History of frequent sinusitis, 1 pneumonia, ear infections, meningitis, foot infection requiring hospitalization. 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 History of ITP in [id number], diagnosed with lupus and on Plaquenil. No  history of blood clots. Covid-19 in 2022. Hyquvia caused localized irritation. On IVIG since March 2023. CT chest in 2023 showed stable axillary adenopathy b/l - most likely reactive and benign. 2024 normal spirometry.  Interim history - tolerating IVIG at infusion center with no issues. No infections/antibiotics. 2024 labs normal.  Continue IV infusions every 4 weeks. Get bloodwork every 6 months - due in April Get before you are due for your infusion in April. Keep track of infections. Normal spirometry today - repeat once a year.    Return in about 6 months (around 08/06/2023).  No orders of the defined types were placed in this encounter.  Lab Orders         IgG, IgA, IgM         CBC with Differential/Platelet         Comprehensive metabolic panel         Sedimentation rate     Diagnostics: None.   Medication List:  Current Outpatient Medications  Medication Sig Dispense Refill   cyanocobalamin  (VITAMIN B12) 1000 MCG/ML injection Inject 1 mL (1,000 mcg total) into the muscle every 14 (fourteen) days. 2 mL 11   DULoxetine  (CYMBALTA ) 20 MG capsule Take 2 capsules (40 mg total) by mouth daily. 90 capsule 0   hydroxychloroquine (PLAQUENIL) 200 MG tablet Take 400 mg by mouth daily.     Immune Globulin 10% 10G/155mL (10,000mg /172mL) SOLN Inject into the vein.     JENCYCLA 0.35 MG tablet Take 1 tablet by mouth daily.     levothyroxine  (SYNTHROID ) 50 MCG tablet Take 0.5 tablets (25 mcg total) by mouth daily. 90 tablet 3   SYRINGE-NEEDLE, DISP, 3 ML  25G X 1 3 ML MISC Inject 1 mL (1,000 mcg total) of B12 into the muscle every 14 (fourteen) days. 2 each 11   Current Facility-Administered Medications  Medication Dose Route Frequency Provider Last Rate Last Admin   cyanocobalamin  (VITAMIN B12) injection 1,000 mcg  1,000 mcg Intramuscular Q14 Days Kuneff, Renee A, DO   1,000 mcg at 01/04/23 1148   Allergies: Allergies  Allergen Reactions   Pseudoephedrine Other (See Comments)    Joint  swelling   I reviewed her past medical history, social history, family history, and environmental history and no significant changes have been reported from her previous visit.  Review of Systems  Constitutional:  Negative for appetite change, chills, fever and unexpected weight change.  HENT:  Negative for congestion, nosebleeds, postnasal drip and rhinorrhea.   Eyes:  Negative for itching.  Respiratory:  Negative for cough, chest tightness, shortness of breath and wheezing.   Cardiovascular:  Negative for chest pain.  Gastrointestinal:  Negative for abdominal pain.  Genitourinary:  Negative for difficulty urinating.  Skin:  Negative for rash.  Allergic/Immunologic: Negative for environmental allergies and food allergies.  Neurological:  Negative for headaches.    Objective: BP 116/84 (BP Location: Left Arm, Patient Position: Sitting, Cuff Size: Normal)   Pulse 87   Temp 98.1 F (36.7 C) (Temporal)   Resp 16   Wt (!) 329 lb (149.2 kg) Comment: per pt.  LMP 01/19/2023   SpO2 96%   BMI 51.53 kg/m  Body mass index is 51.53 kg/m. Physical Exam Vitals and nursing note reviewed.  Constitutional:      Appearance: Normal appearance. She is well-developed. She is obese.  HENT:     Head: Normocephalic and atraumatic.     Right Ear: Tympanic membrane and external ear normal.     Left Ear: Tympanic membrane and external ear normal.     Nose: Nose normal.     Mouth/Throat:     Mouth: Mucous membranes are moist.     Pharynx: Oropharynx is clear.  Eyes:     Conjunctiva/sclera: Conjunctivae normal.  Cardiovascular:     Rate and Rhythm: Normal rate and regular rhythm.     Heart sounds: Normal heart sounds. No murmur heard.    No friction rub. No gallop.  Pulmonary:     Effort: Pulmonary effort is normal.     Breath sounds: Normal breath sounds. No wheezing, rhonchi or rales.  Abdominal:     Palpations: Abdomen is soft.  Musculoskeletal:     Cervical back: Neck supple.  Skin:     General: Skin is warm.     Findings: No bruising or rash.  Neurological:     Mental Status: She is alert and oriented to person, place, and time.  Psychiatric:        Mood and Affect: Mood normal.        Behavior: Behavior normal.    Previous notes and tests were reviewed. The plan was reviewed with the patient/family, and all questions/concerned were addressed.  It was my pleasure to see Shirley Carr today and participate in her care. Please feel free to contact me with any questions or concerns.  Sincerely,  Orlan Cramp, DO Allergy & Immunology  [facility]  [facility] office: [phone] [facility] office: [phone]

## 2023-02-05 NOTE — Progress Notes (Signed)
Patient ID: Shirley Carr, female  DOB: 1990/10/27, 33 y.o.   MRN: [id number] Patient Care Team    Relationship Specialty Notifications Start End  Natalia Leatherwood, DO PCP - General Family Medicine  08/28/22   Casimer Lanius, MD Consulting Physician Rheumatology  02/18/20   Ellamae Sia, DO Consulting Physician Allergy  02/18/20   Zelphia Cairo, MD Consulting Physician Obstetrics and Gynecology  02/18/20   Grant Fontana, MD Referring Physician Ophthalmology  02/18/20   Armbruster, Willaim Rayas, MD Consulting Physician Gastroenterology  02/18/20   Charlott Holler, MD Consulting Physician Pulmonary Disease  12/08/20     Chief Complaint  Patient presents with   Anxiety    Subjective:  Shirley Carr is a 33 y.o. female present for chronic conditions. All past medical history, surgical history, allergies, family history, immunizations, medications and social history were updated in the electronic medical record today. All recent labs, ED visits and hospitalizations within the last year were reviewed.  Hypothyroidism: pt reports compliance with levothyroxine 50 mcg. daily last visit secondary to mildly elevated TSH 4.87, with complaints of fatigue  Lupus: She has a history of lupus and is prescribed Plaquenil.  She is established with rheumatology  Anxiety: Patient reports compliance with Cymbalta 40 mg daily.  She states medication is working well for her today. Initial note: Pt presents for follow-up on her new onset anxiety and panic after starting Cymbalta 30 mg 4 weeks ago.   She reports she definitely has noticed a difference since starting Cymbalta and she feels her panic has improved.  She does not wake up panicked any longer.  She still is having significant anxiety when driving, especially if behind large trucks or if the weather is bad.    02/05/2023    7:46 AM 09/14/2022    8:59 AM 07/12/2022   10:25 AM 04/22/2021    8:16 AM 12/08/2020    9:25 AM  Depression screen PHQ 2/9   Decreased Interest 0 0 2 1 1   Down, Depressed, Hopeless 0 0 0 1 1  PHQ - 2 Score 0 0 2 2 2   Altered sleeping 0  3 2 1   Tired, decreased energy 0  3 3 2   Change in appetite 0  0 1 1  Feeling bad or failure about yourself    0 1 1  Trouble concentrating 0  3 3 1   Moving slowly or fidgety/restless 0  0 1 0  Suicidal thoughts 0  0 0 0  PHQ-9 Score 0  11 13 8   Difficult doing work/chores Not difficult at all          02/05/2023    7:46 AM 07/12/2022   10:25 AM 04/22/2021    8:16 AM 12/08/2020    9:27 AM  GAD 7 : Generalized Anxiety Score  Nervous, Anxious, on Edge 0 0 0 2  Control/stop worrying 0 0 0 2  Worry too much - different things 0 0 0 1  Trouble relaxing 0 0 0 1  Restless 0 0 0 0  Easily annoyed or irritable 0 0 1 2  Afraid - awful might happen 0 0 0 1  Total GAD 7 Score 0 0 1 9  Anxiety Difficulty Not difficult at all             02/05/2023    7:46 AM 09/14/2022    8:59 AM 07/12/2022   10:25 AM 04/20/2022   10:40 AM 02/18/2020   12:58 PM  Fall Risk   Falls in the past year? 0 0 0 0 0  Number falls in past yr: 0 0 0 0 0  Injury with Fall? 0 0 0 0 0  Risk for fall due to : No Fall Risks  No Fall Risks    Follow up Falls evaluation completed Falls evaluation completed Falls evaluation completed Falls evaluation completed Falls evaluation completed   Immunization History  Administered Date(s) Administered   HPV Quadrivalent 07/23/2006, 10/04/2006   Hepatitis B, PED/ADOLESCENT May 12, 1990, 01/14/1992, 06/11/1992   Hpv-Unspecified 07/23/2006, 10/04/2006, 01/31/2007   IPV 04/13/1992, 06/11/1992, 06/15/1993, 02/19/1996   Influenza,inj,quad, With Preservative 10/04/2015   MMR 03/14/1993, 12/11/1996   Meningococcal Conjugate 07/23/2006, 07/23/2006   Moderna Sars-Covid-2 Vaccination 01/09/2020, 02/06/2020   Tdap 02/13/1992, 04/13/1992, 06/11/1992, 06/15/1993, 02/19/1996, 06/23/2006, 07/23/2006, 01/03/2016, 08/29/2016, 09/09/2016   Past Medical History:  Diagnosis Date    Allergy    Anxiety    Arthritis    Blood in stool    Chicken pox    Chronic ITP (idiopathic thrombocytopenia) (HCC)    Colon polyps 02/28/2018   Adenoma-repeat colonoscopy 2 years   COVID-19 07/29/2020   CVID (common variable immunodeficiency) (HCC)    GERD (gastroesophageal reflux disease)    H/O stem cell transplant (HCC) 2014   Hiatal hernia 02/28/2018   Small hiatal hernia on endoscopy, gastritis.  Biopsies negative for H. pylori and celiac   History of asthma 06/26/2019   Lupus    Lupus encephalitis (HCC) 2013   Hospitalized   Osteomyelitis (HCC) 2018   Hospitalized foot/ankle   Polyp of colon 02/27/2018   Raynaud's disease    Recurrent upper respiratory infection (URI)    Systemic lupus erythematosus encephalitis (HCC) 02/04/2020   UTI (urinary tract infection)    Allergies  Allergen Reactions   Pseudoephedrine Other (See Comments)    Joint swelling   Past Surgical History:  Procedure Laterality Date   COLONOSCOPY  02/28/2018   Adenoma-repeat colonoscopy 2 years   CYST EXCISION Right 2020   Benign cyst excision right arm x2   ESOPHAGOGASTRODUODENOSCOPY  02/28/2018   Small hiatal hernia, mild gastritis, biopsies negative for H. pylori and celiac   GANGLION CYST EXCISION Right 2007   Right wrist   HARDWARE REMOVAL Right 2020   Right ankle plate and screws removal   ORIF ANKLE FRACTURE Right 2019   Plates and screws placed (removed in 2020)   Family History  Problem Relation Age of Onset   Urticaria Mother    Asthma Mother    Depression Mother    Hypertension Mother    Miscarriages / [place] Mother    Colon polyps Mother    Eczema Brother    Arthritis Brother    Alcohol abuse Father    Heart attack Father    Allergic rhinitis Neg Hx    Colon cancer Neg Hx    Esophageal cancer Neg Hx    Stomach cancer Neg Hx    Social History   Social History Narrative   Marital status/children/pets: single   Education/employment: masters degree. Employed as a  Gaffer:      -smoke alarm in the home:No     - wears seatbelt: No     - Feels safe in their relationships: No    Allergies as of 02/05/2023       Reactions   Pseudoephedrine Other (See Comments)   Joint swelling        Medication List  Accurate as of February 05, 2023  8:01 AM. If you have any questions, ask your nurse or doctor.          cyanocobalamin 1000 MCG/ML injection Commonly known as: VITAMIN B12 Inject 1 mL (1,000 mcg total) into the muscle every 14 (fourteen) days.   DULoxetine 20 MG capsule Commonly known as: CYMBALTA Take 2 capsules (40 mg total) by mouth daily.   hydroxychloroquine 200 MG tablet Commonly known as: PLAQUENIL Take 400 mg by mouth daily.   Jencycla 0.35 MG tablet Generic drug: norethindrone Take 1 tablet by mouth daily.   levothyroxine 50 MCG tablet Commonly known as: SYNTHROID Take 0.5 tablets (25 mcg total) by mouth daily.   Privigen 10 GM/100ML Soln Generic drug: Immune Globulin 10% Inject into the vein.   SYRINGE-NEEDLE (DISP) 3 ML 25G X 1" 3 ML Misc Inject 1 mL (1,000 mcg total) of B12 into the muscle every 14 (fourteen) days.       All past medical history, surgical history, allergies, family history, immunizations andmedications were updated in the EMR today and reviewed under the history and medication portions of their EMR.      ROS 14 pt review of systems performed and negative (unless mentioned in an HPI)  Objective: BP 130/88   Pulse 88   Temp 98.1 F (36.7 C)   Wt (!) 328 lb 6.4 oz (149 kg)   LMP 01/19/2023   SpO2 100%   BMI 51.43 kg/m  Physical Exam Vitals and nursing note reviewed.  Constitutional:      General: She is not in acute distress.    Appearance: Normal appearance. She is normal weight. She is not ill-appearing or toxic-appearing.  HENT:     Head: Normocephalic and atraumatic.  Eyes:     General: No scleral icterus.       Right eye: No discharge.        Left  eye: No discharge.     Extraocular Movements: Extraocular movements intact.     Conjunctiva/sclera: Conjunctivae normal.     Pupils: Pupils are equal, round, and reactive to light.  Skin:    Findings: No rash.  Neurological:     Mental Status: She is alert and oriented to person, place, and time. Mental status is at baseline.     Motor: No weakness.     Coordination: Coordination normal.     Gait: Gait normal.  Psychiatric:        Mood and Affect: Mood normal.        Behavior: Behavior normal.        Thought Content: Thought content normal.        Judgment: Judgment normal.    No results found.  Assessment/plan: Undine Nealis is a 33 y.o. female present for  chronic condition management Anxiety/panic Stable Continue Cymbalta 40 mg daily  Vitamin D deficiency/B12 deficiency/folate deficiency: Supplementing folate, vitamin D and B12. Levels improved with supplementation, Vitamin D: 22.09> 40.6 (labs up-to-date 09/2022) B12: 201> 533 (labs up-to-date 09/2022) Folate: 5.7> 17.2 (labs up-to-date 09/2022)  Lupus (HCC)/CVID (common variable immunodeficiency) (HCC) Managed by rheum and allergy Prescribed Plaquenil  Hypothyroidism: Continue levo 50 mcg Thyroid panel up-to-date 07/2022 Could consider cytomel Goal TSH 2.72 or less 33 year old female.  Fatigue, unspecified type Fatigue could be secondary to her chronic conditions of lupus, CVID or new conditions of sleep apnea.   - IBC + Ferritin-within normal limits, low normal - PTH, Intact and Calcium-WNL - CBC w/Diff-WNL - Angiotensin converting enzyme-WNL -  Sedimentation rate-WNL (very mild elevation in the past on 2 occasions, normalized)  OSA on cpap- total AHI 18.7/hr and O2 nadir 89%. AutoPAP    Return in about 24 weeks (around 07/23/2023) for cpe (20 min), Routine chronic condition follow-up.   No orders of the defined types were placed in this encounter.  Meds ordered this encounter  Medications   DULoxetine  (CYMBALTA) 20 MG capsule    Sig: Take 2 capsules (40 mg total) by mouth daily.    Dispense:  90 capsule    Refill:  0   levothyroxine (SYNTHROID) 50 MCG tablet    Sig: Take 0.5 tablets (25 mcg total) by mouth daily.    Dispense:  90 tablet    Refill:  3   Referral Orders  No referral(s) requested today      Note is dictated utilizing voice recognition software. Although note has been proof read prior to signing, occasional typographical errors still can be missed. If any questions arise, please do not hesitate to call for verification.  Electronically signed by: Felix Pacini, DO [facility] [place]

## 2023-02-05 NOTE — Patient Instructions (Addendum)
Return in about 24 weeks (around 07/23/2023) for cpe (20 min), Routine chronic condition follow-up.        Great to see you today.  I have refilled the medication(s) we provide.   If labs were collected or images ordered, we will inform you of  results once we have received them and reviewed. We will contact you either by echart message, or telephone call.  Please give ample time to the testing facility, and our office to run,  receive and review results. Please do not call inquiring of results, even if you can see them in your chart. We will contact you as soon as we are able. If it has been over 1 week since the test was completed, and you have not yet heard from [place], then please call us.    - echart message- for normal results that have been seen by the patient already.   - telephone call: abnormal results or if patient has not viewed results in their echart.  If a referral to a specialist was entered for you, please call us in 2 weeks if you have not heard from the specialist office to schedule.

## 2023-02-06 ENCOUNTER — Other Ambulatory Visit: Payer: Self-pay

## 2023-02-06 ENCOUNTER — Encounter: Payer: Self-pay | Admitting: Allergy

## 2023-02-06 ENCOUNTER — Ambulatory Visit (INDEPENDENT_AMBULATORY_CARE_PROVIDER_SITE_OTHER): Payer: BC Managed Care – PPO | Admitting: Allergy

## 2023-02-06 VITALS — BP 116/84 | HR 87 | Temp 98.1°F | Resp 16 | Wt 329.0 lb

## 2023-02-06 DIAGNOSIS — J310 Chronic rhinitis: Secondary | ICD-10-CM

## 2023-02-06 DIAGNOSIS — D839 Common variable immunodeficiency, unspecified: Secondary | ICD-10-CM | POA: Diagnosis not present

## 2023-02-06 NOTE — Patient Instructions (Addendum)
CVID Continue IV infusions every 4 weeks. Get bloodwork every 6 months - due in April Get before you are due for your infusion in April. Keep track of infections.  Breathing test is normal today.   Follow up in 6 months or sooner if needed.

## 2023-02-16 DIAGNOSIS — D839 Common variable immunodeficiency, unspecified: Secondary | ICD-10-CM | POA: Diagnosis not present

## 2023-02-21 ENCOUNTER — Encounter: Payer: Self-pay | Admitting: Allergy

## 2023-02-23 IMAGING — CT CT CHEST W/O CM
2 of 4 series · 15 of 36 positions shown, 18 images · non-contrast
Comparison: None.

CLINICAL DATA: COVID.

EXAM:
CT CHEST WITHOUT CONTRAST
TECHNIQUE: Multidetector CT imaging of the chest was performed following the
standard protocol without IV contrast.

[Series 2: thorax · axial · 0.65mm/px · z∈[-134,+120]mm · 12 of 151 slices shown, 15 images]
[im 12/151  mediastinal]
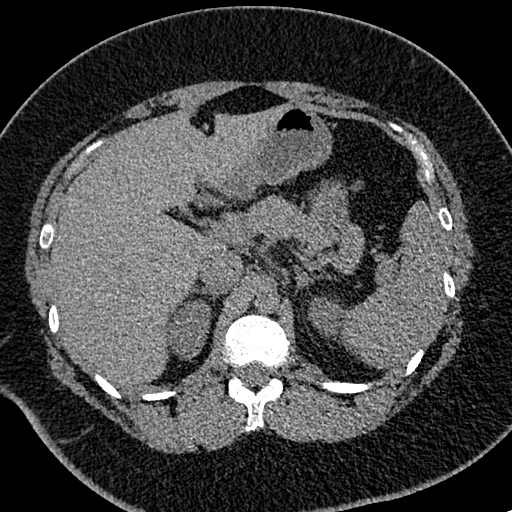
[im 12/151  lung]
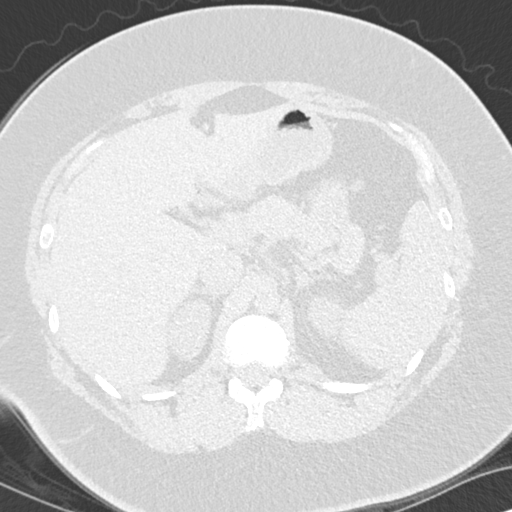
[im 24/151  lung]
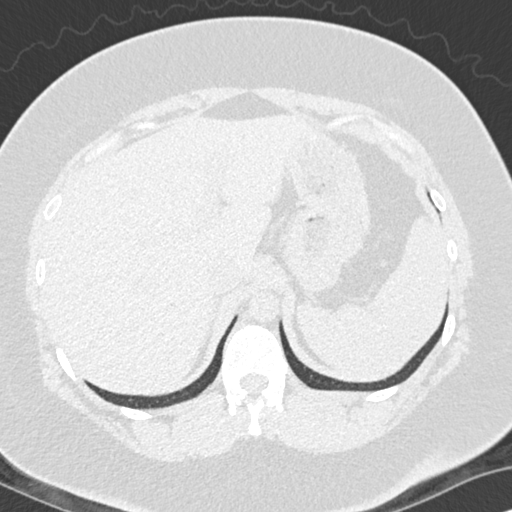
[im 35/151  lung]
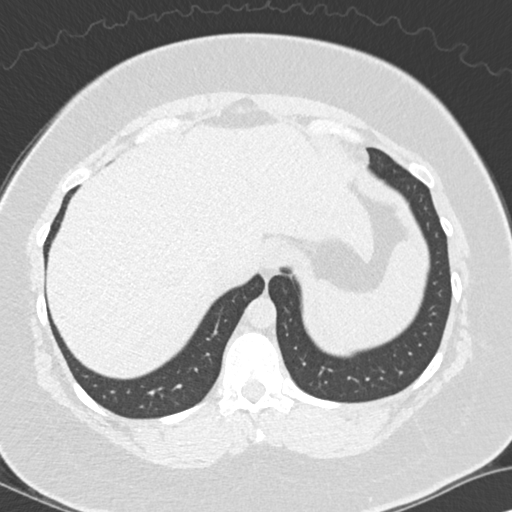
[im 47/151  lung]
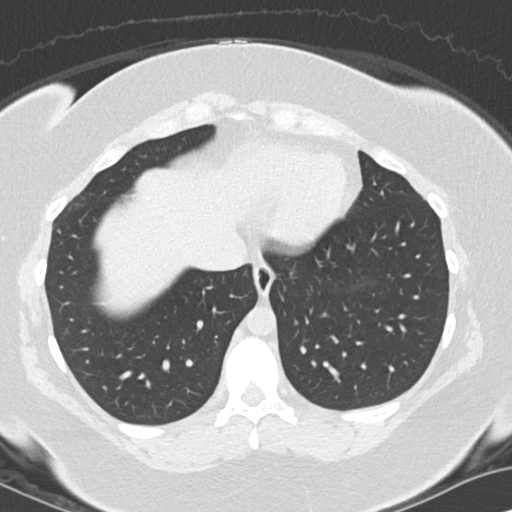
[im 58/151  mediastinal]
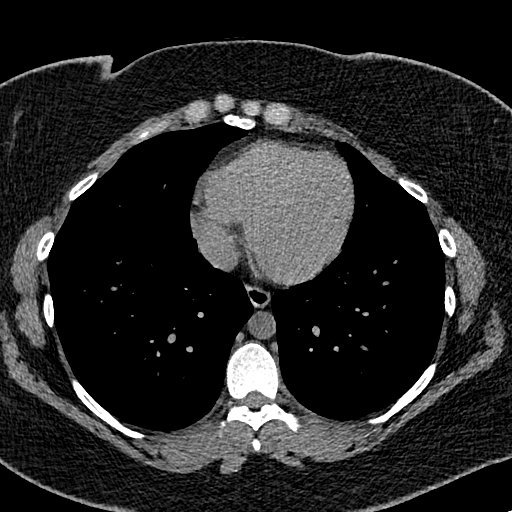
[im 58/151  lung]
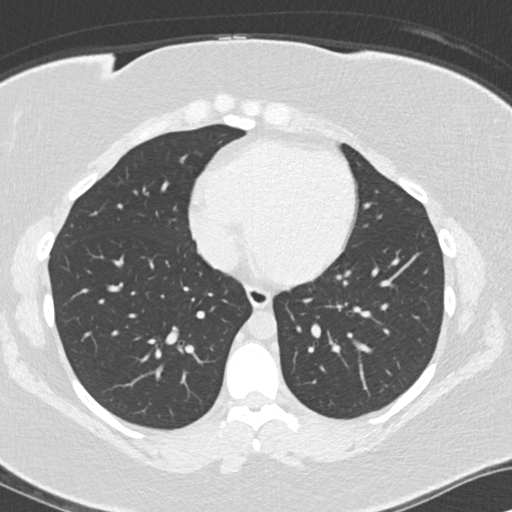
[im 70/151  lung]
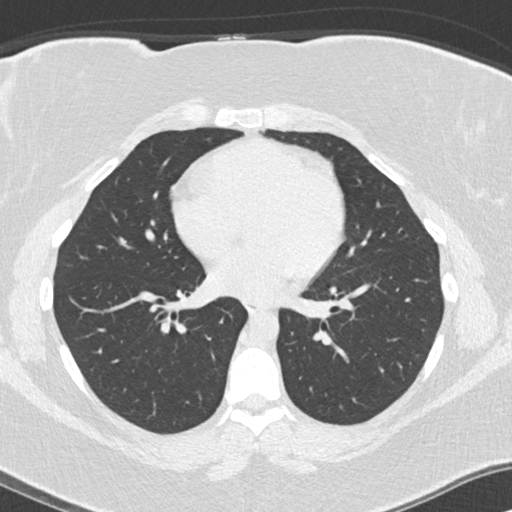
[im 81/151  lung]
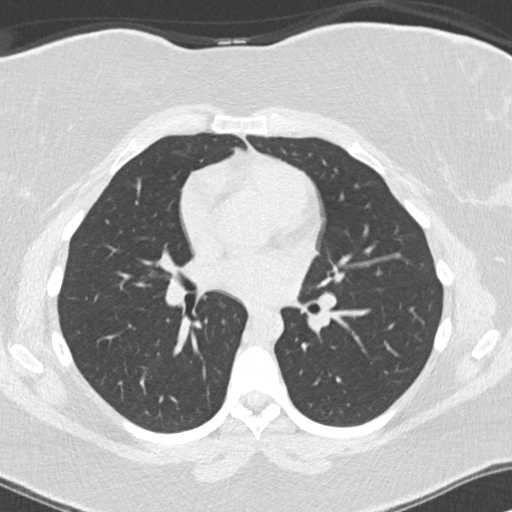
[im 93/151  lung]
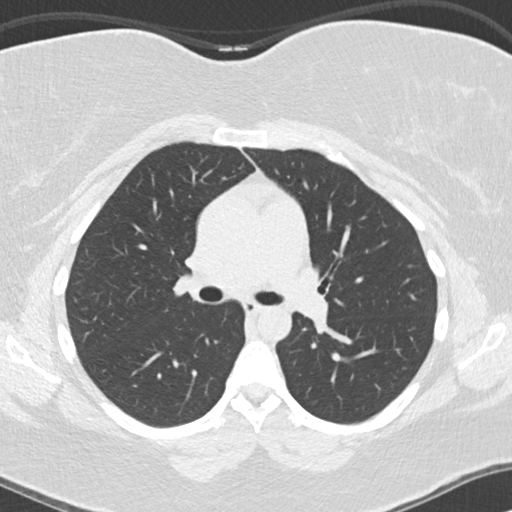
[im 104/151  mediastinal]
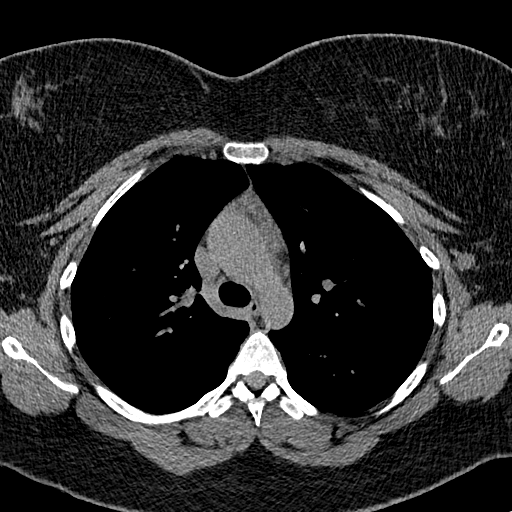
[im 104/151  lung]
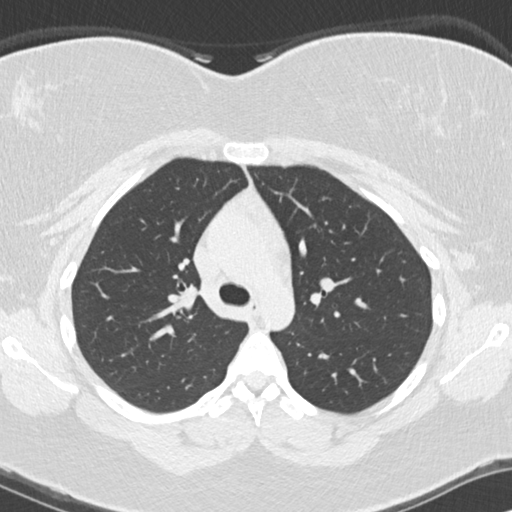
[im 116/151  lung]
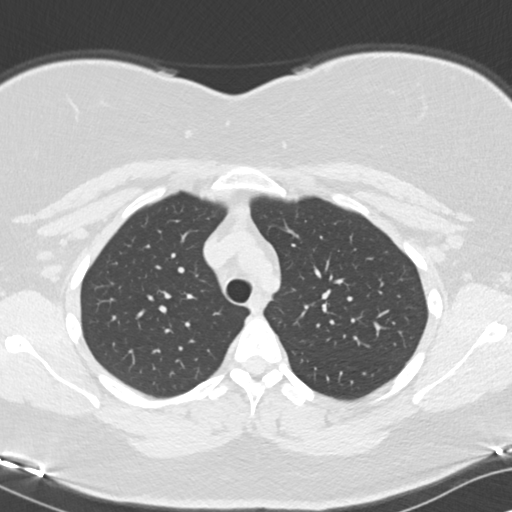
[im 127/151  lung]
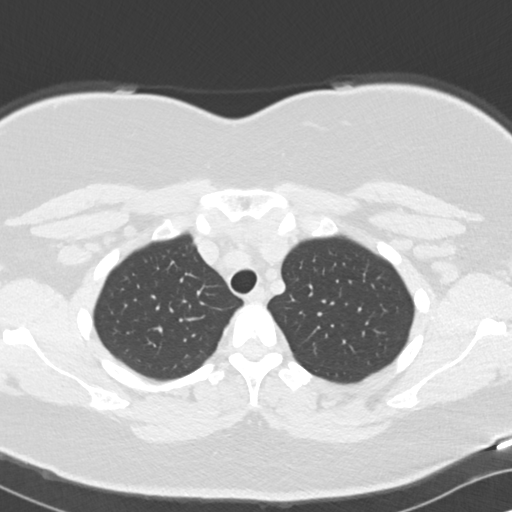
[im 139/151  lung]
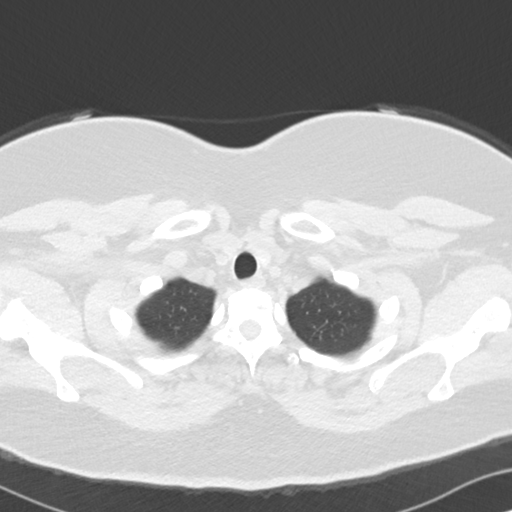

[Series 6: coronal · coronal · 0.59mm/px · 3 of 151 slices shown]
[im 31/151  lung]
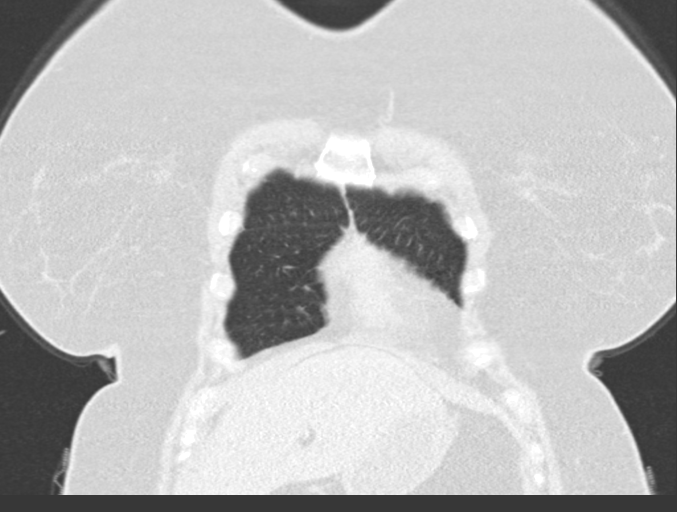
[im 61/151  lung]
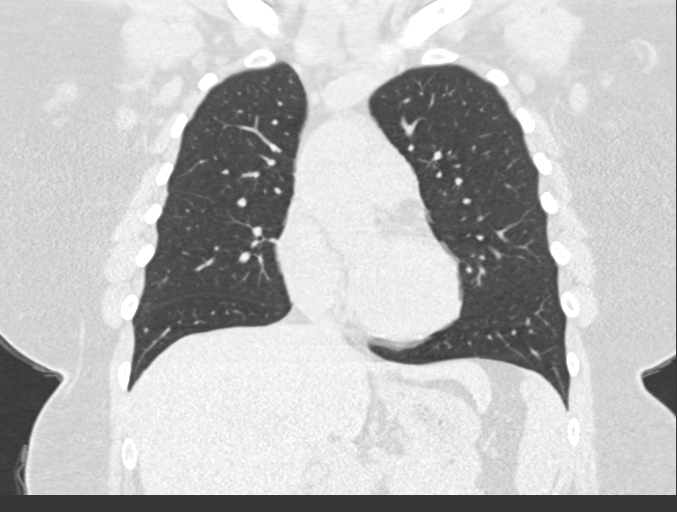
[im 91/151  lung]
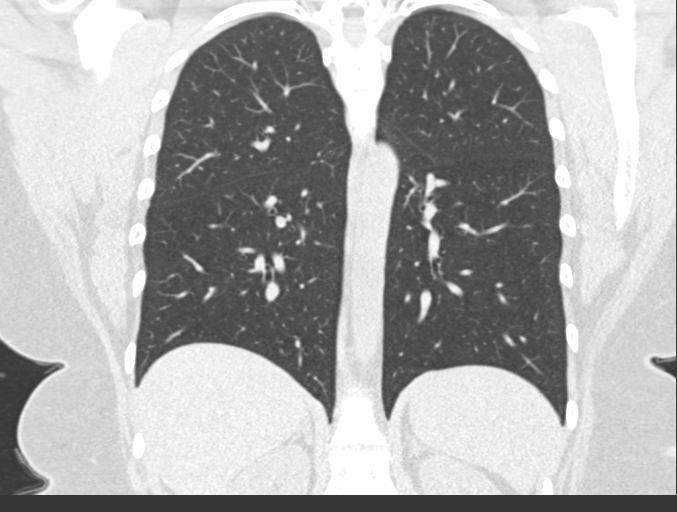

[15 of 36 positions shown; findings below may reference images not displayed]

FINDINGS: Cardiovascular: No significant vascular findings. Normal heart size.
No pericardial effusion.

Mediastinum/Nodes: No enlarged mediastinal or hilar lymph nodes.
Thyroid gland, trachea, and esophagus demonstrate no significant
findings. Residual thymic tissue is present.

Lungs/Pleura: Lungs are clear. No pleural effusion or pneumothorax.

Upper Abdomen: No acute abnormality.

Musculoskeletal: No chest wall mass or suspicious bone lesions
identified. There are nonenlarged and mildly enlarged left hilar
lymph nodes measuring up to 12 mm short axis. There also nonenlarged
and enlarged right hilar lymph nodes measuring up to 12 mm.
IMPRESSION: 1. Bilateral hilar lymphadenopathy of uncertain etiology. Recommend
clinical correlation and follow-up.
2. No other acute cardiopulmonary process.

## 2023-02-26 ENCOUNTER — Encounter: Payer: Self-pay | Admitting: Allergy

## 2023-02-26 DIAGNOSIS — G4733 Obstructive sleep apnea (adult) (pediatric): Secondary | ICD-10-CM | POA: Diagnosis not present

## 2023-02-27 NOTE — Telephone Encounter (Signed)
 Forwarding myChart message to provider - patient requesting letter excusing her from jury duty.

## 2023-03-01 NOTE — Telephone Encounter (Signed)
 Please call patient and let her know that her letter is ready to be picked up today in OR.

## 2023-03-01 NOTE — Telephone Encounter (Signed)
 I called patient and left a message to call the office back. A mychart message has been sent to her informing of letter.

## 2023-03-16 DIAGNOSIS — D839 Common variable immunodeficiency, unspecified: Secondary | ICD-10-CM | POA: Diagnosis not present

## 2023-03-21 DIAGNOSIS — Z6841 Body Mass Index (BMI) 40.0 and over, adult: Secondary | ICD-10-CM | POA: Diagnosis not present

## 2023-03-21 DIAGNOSIS — Z01419 Encounter for gynecological examination (general) (routine) without abnormal findings: Secondary | ICD-10-CM | POA: Diagnosis not present

## 2023-03-26 DIAGNOSIS — G4733 Obstructive sleep apnea (adult) (pediatric): Secondary | ICD-10-CM | POA: Diagnosis not present

## 2023-04-13 DIAGNOSIS — D839 Common variable immunodeficiency, unspecified: Secondary | ICD-10-CM | POA: Diagnosis not present

## 2023-04-14 LAB — CBC WITH DIFFERENTIAL/PLATELET
Basophils Absolute: 0 10*3/uL (ref 0.0–0.2)
Basos: 1 %
EOS (ABSOLUTE): 0.1 10*3/uL (ref 0.0–0.4)
Eos: 1 %
Hematocrit: 43.4 % (ref 34.0–46.6)
Hemoglobin: 14.4 g/dL (ref 11.1–15.9)
Immature Grans (Abs): 0 10*3/uL (ref 0.0–0.1)
Immature Granulocytes: 0 %
Lymphocytes Absolute: 1.9 10*3/uL (ref 0.7–3.1)
Lymphs: 30 %
MCH: 28.3 pg (ref 26.6–33.0)
MCHC: 33.2 g/dL (ref 31.5–35.7)
MCV: 85 fL (ref 79–97)
Monocytes Absolute: 0.6 10*3/uL (ref 0.1–0.9)
Monocytes: 10 %
Neutrophils Absolute: 3.7 10*3/uL (ref 1.4–7.0)
Neutrophils: 58 %
Platelets: 286 10*3/uL (ref 150–450)
RBC: 5.08 x10E6/uL (ref 3.77–5.28)
RDW: 12.8 % (ref 11.7–15.4)
WBC: 6.3 10*3/uL (ref 3.4–10.8)

## 2023-04-14 LAB — COMPREHENSIVE METABOLIC PANEL WITH GFR
ALT: 17 IU/L (ref 0–32)
AST: 21 IU/L (ref 0–40)
Albumin: 4.6 g/dL (ref 3.9–4.9)
Alkaline Phosphatase: 69 IU/L (ref 44–121)
BUN/Creatinine Ratio: 15 (ref 9–23)
BUN: 14 mg/dL (ref 6–20)
Bilirubin Total: 0.2 mg/dL (ref 0.0–1.2)
CO2: 22 mmol/L (ref 20–29)
Calcium: 9.4 mg/dL (ref 8.7–10.2)
Chloride: 102 mmol/L (ref 96–106)
Creatinine, Ser: 0.92 mg/dL (ref 0.57–1.00)
Globulin, Total: 2.4 g/dL (ref 1.5–4.5)
Glucose: 81 mg/dL (ref 70–99)
Potassium: 4.2 mmol/L (ref 3.5–5.2)
Sodium: 139 mmol/L (ref 134–144)
Total Protein: 7 g/dL (ref 6.0–8.5)
eGFR: 85 mL/min/{1.73_m2} (ref >59)

## 2023-04-14 LAB — IGG, IGA, IGM
IgA/Immunoglobulin A, Serum: 18 mg/dL — ABNORMAL LOW (ref 87–352)
IgG (Immunoglobin G), Serum: 1048 mg/dL (ref 586–1602)
IgM (Immunoglobulin M), Srm: 22 mg/dL — ABNORMAL LOW (ref 26–217)

## 2023-04-14 LAB — SEDIMENTATION RATE: Sed Rate: 3 mm/h (ref 0–32)

## 2023-04-16 ENCOUNTER — Encounter: Payer: Self-pay | Admitting: Allergy

## 2023-04-30 DIAGNOSIS — G4733 Obstructive sleep apnea (adult) (pediatric): Secondary | ICD-10-CM | POA: Diagnosis not present

## 2023-05-11 DIAGNOSIS — D839 Common variable immunodeficiency, unspecified: Secondary | ICD-10-CM | POA: Diagnosis not present

## 2023-05-26 DIAGNOSIS — G4733 Obstructive sleep apnea (adult) (pediatric): Secondary | ICD-10-CM | POA: Diagnosis not present

## 2023-06-08 DIAGNOSIS — D839 Common variable immunodeficiency, unspecified: Secondary | ICD-10-CM | POA: Diagnosis not present

## 2023-06-26 DIAGNOSIS — G4733 Obstructive sleep apnea (adult) (pediatric): Secondary | ICD-10-CM | POA: Diagnosis not present

## 2023-07-09 ENCOUNTER — Other Ambulatory Visit: Payer: Self-pay | Admitting: Family Medicine

## 2023-07-13 DIAGNOSIS — D839 Common variable immunodeficiency, unspecified: Secondary | ICD-10-CM | POA: Diagnosis not present

## 2023-07-25 ENCOUNTER — Ambulatory Visit: Payer: BC Managed Care – PPO | Admitting: Family Medicine

## 2023-07-25 ENCOUNTER — Encounter: Payer: Self-pay | Admitting: Family Medicine

## 2023-07-25 VITALS — BP 138/80 | HR 87 | Temp 98.3°F | Ht 68.31 in | Wt 334.4 lb

## 2023-07-25 DIAGNOSIS — E063 Autoimmune thyroiditis: Secondary | ICD-10-CM | POA: Diagnosis not present

## 2023-07-25 DIAGNOSIS — Z13220 Encounter for screening for lipoid disorders: Secondary | ICD-10-CM

## 2023-07-25 DIAGNOSIS — M3219 Other organ or system involvement in systemic lupus erythematosus: Secondary | ICD-10-CM | POA: Diagnosis not present

## 2023-07-25 DIAGNOSIS — Z6841 Body Mass Index (BMI) 40.0 and over, adult: Secondary | ICD-10-CM | POA: Diagnosis not present

## 2023-07-25 DIAGNOSIS — F410 Panic disorder [episodic paroxysmal anxiety] without agoraphobia: Secondary | ICD-10-CM | POA: Diagnosis not present

## 2023-07-25 DIAGNOSIS — D6862 Lupus anticoagulant syndrome: Secondary | ICD-10-CM

## 2023-07-25 DIAGNOSIS — Z0000 Encounter for general adult medical examination without abnormal findings: Secondary | ICD-10-CM

## 2023-07-25 DIAGNOSIS — F419 Anxiety disorder, unspecified: Secondary | ICD-10-CM | POA: Diagnosis not present

## 2023-07-25 DIAGNOSIS — D693 Immune thrombocytopenic purpura: Secondary | ICD-10-CM | POA: Diagnosis not present

## 2023-07-25 DIAGNOSIS — E538 Deficiency of other specified B group vitamins: Secondary | ICD-10-CM

## 2023-07-25 DIAGNOSIS — E559 Vitamin D deficiency, unspecified: Secondary | ICD-10-CM

## 2023-07-25 DIAGNOSIS — M069 Rheumatoid arthritis, unspecified: Secondary | ICD-10-CM

## 2023-07-25 DIAGNOSIS — D839 Common variable immunodeficiency, unspecified: Secondary | ICD-10-CM | POA: Diagnosis not present

## 2023-07-25 DIAGNOSIS — Z131 Encounter for screening for diabetes mellitus: Secondary | ICD-10-CM

## 2023-07-25 DIAGNOSIS — R030 Elevated blood-pressure reading, without diagnosis of hypertension: Secondary | ICD-10-CM | POA: Diagnosis not present

## 2023-07-25 LAB — LIPID PANEL
Cholesterol: 161 mg/dL (ref 0–200)
HDL: 50.9 mg/dL (ref >39.00)
LDL Cholesterol: 96 mg/dL (ref 0–99)
NonHDL: 110.56
Total CHOL/HDL Ratio: 3
Triglycerides: 71 mg/dL (ref 0.0–149.0)
VLDL: 14.2 mg/dL (ref 0.0–40.0)

## 2023-07-25 LAB — B12 AND FOLATE PANEL
Folate: >23.4 ng/mL (ref >5.9)
Vitamin B-12: 993 pg/mL — ABNORMAL HIGH (ref 211–911)

## 2023-07-25 LAB — HEMOGLOBIN A1C: Hgb A1c MFr Bld: 5.5 % (ref 4.6–6.5)

## 2023-07-25 LAB — T4, FREE: Free T4: 0.73 ng/dL (ref 0.60–1.60)

## 2023-07-25 LAB — VITAMIN D 25 HYDROXY (VIT D DEFICIENCY, FRACTURES): VITD: 48.11 ng/mL (ref 30.00–100.00)

## 2023-07-25 LAB — TSH: TSH: 3.53 u[IU]/mL (ref 0.35–5.50)

## 2023-07-25 MED ORDER — DULOXETINE HCL 20 MG PO CPEP: 40.0000 mg | ORAL_CAPSULE | Freq: Every day | ORAL | 1 refills | Status: DC

## 2023-07-25 NOTE — Progress Notes (Signed)
 Patient ID: Shirley Carr, female  DOB: 1990-01-23, 33 y.o.   MRN: [id number] Patient Care Team    Relationship Specialty Notifications Start End  Catherine Charlies LABOR, DO PCP - General Family Medicine  08/28/22   Curt Lighter, MD Consulting Physician Rheumatology  02/18/20   Luke Orlan HERO, DO Consulting Physician Allergy  02/18/20   Latisha Medford, MD Consulting Physician Obstetrics and Gynecology  02/18/20   Lelon JONELLE Ferrari, MD Referring Physician Ophthalmology  02/18/20   Armbruster, Elspeth SQUIBB, MD Consulting Physician Gastroenterology  02/18/20   Meade Verdon RAMAN, MD Consulting Physician Pulmonary Disease  12/08/20     Chief Complaint  Patient presents with   Annual Exam    Chronic Conditions/illness Management/ pat is fasting     Subjective:  Shirley Carr is a 33 y.o. female present for CPE and combined chronic condition management appointments  All past medical history, surgical history, allergies, family history, immunizations, medications and social history were updated in the electronic medical record today. All recent labs, ED visits and hospitalizations within the last year were reviewed.  Health maintenance:  Colonoscopy: 04/06/2020, Dr. Leigh.  5-year follow-up. Mammogram: no fhx.  Routine screening at 41 Cervical cancer screening: last pap: 2024,completed by:Dr. Latisha.  Requested records Immunizations: tdap UTD 2018,Influenza declined (encouraged yearly) Infectious disease screening: HIV and  Hep C screen completed DEXA:per rheumatology recs Patient has a Dental home. Hospitalizations/ED visits: Reviewed  Hypothyroidism: pt reports compliance with levothyroxine  50 mcg.daily.  Lupus: She has a history of lupus and is prescribed Plaquenil.  She is established with rheumatology  Anxiety: Patient reports compliance with Cymbalta  40 mg daily.  She states medication is working well for her today. Initial note: Pt presents for follow-up on her new onset anxiety and panic  after starting Cymbalta  30 mg 4 weeks ago.   She reports she definitely has noticed a difference since starting Cymbalta  and she feels her panic has improved.  She does not wake up panicked any longer.  She still is having significant anxiety when driving, especially if behind large trucks or if the weather is bad.    07/25/2023    7:44 AM 02/05/2023    7:46 AM 09/14/2022    8:59 AM 07/12/2022   10:25 AM 04/22/2021    8:16 AM  Depression screen PHQ 2/9  Decreased Interest 1 0 0 2 1  Down, Depressed, Hopeless 1 0 0 0 1  PHQ - 2 Score 2 0 0 2 2  Altered sleeping 2 0  3 2  Tired, decreased energy 2 0  3 3  Change in appetite 1 0  0 1  Feeling bad or failure about yourself  1   0 1  Trouble concentrating 2 0  3 3  Moving slowly or fidgety/restless 0 0  0 1  Suicidal thoughts 0 0  0 0  PHQ-9 Score 10 0  11 13  Difficult doing work/chores Not difficult at all Not difficult at all         07/25/2023    7:44 AM 02/05/2023    7:46 AM 07/12/2022   10:25 AM 04/22/2021    8:16 AM  GAD 7 : Generalized Anxiety Score  Nervous, Anxious, on Edge 1 0 0 0  Control/stop worrying 0 0 0 0  Worry too much - different things 0 0 0 0  Trouble relaxing 0 0 0 0  Restless 0 0 0 0  Easily annoyed or irritable 1 0 0 1  Afraid - awful might happen 0 0 0 0  Total GAD 7 Score 2 0 0 1  Anxiety Difficulty Not difficult at all Not difficult at all            07/25/2023    7:44 AM 02/05/2023    7:46 AM 09/14/2022    8:59 AM 07/12/2022   10:25 AM 04/20/2022   10:40 AM  Fall Risk   Falls in the past year? 0 0 0 0 0  Number falls in past yr: 0 0 0 0 0  Injury with Fall? 0 0 0 0 0  Risk for fall due to : No Fall Risks No Fall Risks  No Fall Risks   Follow up Falls evaluation completed Falls evaluation completed Falls evaluation completed Falls evaluation completed Falls evaluation completed   Immunization History  Administered Date(s) Administered   HPV Quadrivalent 07/23/2006, 10/04/2006   Hepatitis B,  PED/ADOLESCENT 01-08-1990, 01/14/1992, 06/11/1992   Hpv-Unspecified 07/23/2006, 10/04/2006, 01/31/2007   IPV 04/13/1992, 06/11/1992, 06/15/1993, 02/19/1996   Influenza,inj,quad, With Preservative 10/04/2015   MMR 03/14/1993, 12/11/1996   Meningococcal Conjugate 07/23/2006, 07/23/2006   Moderna Sars-Covid-2 Vaccination 01/09/2020, 02/06/2020   Tdap 02/13/1992, 04/13/1992, 06/11/1992, 06/15/1993, 02/19/1996, 06/23/2006, 07/23/2006, 01/03/2016, 08/29/2016, 09/09/2016   Past Medical History:  Diagnosis Date   Allergy    Anxiety    Arthritis    Blood in stool    Chicken pox    Chronic ITP (idiopathic thrombocytopenia) (HCC)    Colon polyps 02/28/2018   Adenoma-repeat colonoscopy 2 years   COVID-19 07/29/2020   CVID (common variable immunodeficiency) (HCC)    GERD (gastroesophageal reflux disease)    H/O stem cell transplant (HCC) 2014   Hiatal hernia 02/28/2018   Small hiatal hernia on endoscopy, gastritis.  Biopsies negative for H. pylori and celiac   History of asthma 06/26/2019   Lupus    Lupus encephalitis (HCC) 2013   Hospitalized   Osteomyelitis (HCC) 2018   Hospitalized foot/ankle   Polyp of colon 02/27/2018   Raynaud's disease    Recurrent upper respiratory infection (URI)    Systemic lupus erythematosus encephalitis (HCC) 02/04/2020   UTI (urinary tract infection)    Allergies  Allergen Reactions   Pseudoephedrine Other (See Comments)    Joint swelling   Past Surgical History:  Procedure Laterality Date   COLONOSCOPY  02/28/2018   Adenoma-repeat colonoscopy 2 years   CYST EXCISION Right 2020   Benign cyst excision right arm x2   ESOPHAGOGASTRODUODENOSCOPY  02/28/2018   Small hiatal hernia, mild gastritis, biopsies negative for H. pylori and celiac   GANGLION CYST EXCISION Right 2007   Right wrist   HARDWARE REMOVAL Right 2020   Right ankle plate and screws removal   ORIF ANKLE FRACTURE Right 2019   Plates and screws placed (removed in 2020)   Family  History  Problem Relation Age of Onset   Urticaria Mother    Asthma Mother    Depression Mother    Hypertension Mother    Miscarriages / [place] Mother    Colon polyps Mother    Eczema Brother    Arthritis Brother    Alcohol abuse Father    Heart attack Father    Allergic rhinitis Neg Hx    Colon cancer Neg Hx    Esophageal cancer Neg Hx    Stomach cancer Neg Hx    Social History   Social History Narrative   Marital status/children/pets: single   Education/employment: masters degree. Employed as a Industrial/product designer  loan closer   Safety:      -smoke alarm in the home:No     - wears seatbelt: No     - Feels safe in their relationships: No    Allergies as of 07/25/2023       Reactions   Pseudoephedrine Other (See Comments)   Joint swelling        Medication List        Accurate as of July 25, 2023  8:08 AM. If you have any questions, ask your nurse or doctor.          cyanocobalamin  1000 MCG/ML injection Commonly known as: VITAMIN B12 Inject 1 mL (1,000 mcg total) into the muscle every 14 (fourteen) days.   DULoxetine  20 MG capsule Commonly known as: CYMBALTA  Take 2 capsules (40 mg total) by mouth daily.   hydroxychloroquine 200 MG tablet Commonly known as: PLAQUENIL Take 400 mg by mouth daily.   Jencycla 0.35 MG tablet Generic drug: norethindrone Take 1 tablet by mouth daily.   levothyroxine  50 MCG tablet Commonly known as: SYNTHROID  Take 0.5 tablets (25 mcg total) by mouth daily.   Privigen  10 GM/100ML Soln Generic drug: Immune Globulin 10% Inject into the vein.   SYRINGE-NEEDLE (DISP) 3 ML 25G X 1 3 ML Misc Inject 1 mL (1,000 mcg total) of B12 into the muscle every 14 (fourteen) days.       All past medical history, surgical history, allergies, family history, immunizations andmedications were updated in the EMR today and reviewed under the history and medication portions of their EMR.      ROS 14 pt review of systems performed and negative  (unless mentioned in an HPI)  Objective: BP 138/88   Pulse 87   Temp 98.3 F (36.8 C)   Ht 5' 8.31 (1.735 m)   Wt (!) 334 lb 6.4 oz (151.7 kg)   LMP 06/05/2023   SpO2 99%   BMI 50.39 kg/m  Physical Exam Vitals and nursing note reviewed.  Constitutional:      General: She is not in acute distress.    Appearance: Normal appearance. She is obese. She is not ill-appearing or toxic-appearing.  HENT:     Head: Normocephalic and atraumatic.     Right Ear: Tympanic membrane, ear canal and external ear normal. There is no impacted cerumen.     Left Ear: Tympanic membrane, ear canal and external ear normal. There is no impacted cerumen.     Nose: No congestion or rhinorrhea.     Mouth/Throat:     Mouth: Mucous membranes are moist.     Pharynx: Oropharynx is clear. No oropharyngeal exudate or posterior oropharyngeal erythema.  Eyes:     General: No scleral icterus.       Right eye: No discharge.        Left eye: No discharge.     Extraocular Movements: Extraocular movements intact.     Conjunctiva/sclera: Conjunctivae normal.     Pupils: Pupils are equal, round, and reactive to light.  Cardiovascular:     Rate and Rhythm: Normal rate and regular rhythm.     Pulses: Normal pulses.     Heart sounds: Normal heart sounds. No murmur heard.    No friction rub. No gallop.  Pulmonary:     Effort: Pulmonary effort is normal. No respiratory distress.     Breath sounds: Normal breath sounds. No stridor. No wheezing, rhonchi or rales.  Chest:     Chest wall: No tenderness.  Abdominal:  General: Abdomen is flat. Bowel sounds are normal. There is no distension.     Palpations: Abdomen is soft. There is no mass.     Tenderness: There is no abdominal tenderness. There is no right CVA tenderness, left CVA tenderness, guarding or rebound.     Hernia: No hernia is present.  Musculoskeletal:        General: No swelling, tenderness or deformity. Normal range of motion.     Cervical back: Normal  range of motion and neck supple. No rigidity or tenderness.     Right lower leg: No edema.     Left lower leg: No edema.  Lymphadenopathy:     Cervical: No cervical adenopathy.  Skin:    General: Skin is warm and dry.     Coloration: Skin is not jaundiced or pale.     Findings: No bruising, erythema, lesion or rash.  Neurological:     General: No focal deficit present.     Mental Status: She is alert and oriented to person, place, and time. Mental status is at baseline.     Cranial Nerves: No cranial nerve deficit.     Sensory: No sensory deficit.     Motor: No weakness.     Coordination: Coordination normal.     Gait: Gait normal.     Deep Tendon Reflexes: Reflexes normal.  Psychiatric:        Mood and Affect: Mood normal.        Behavior: Behavior normal.        Thought Content: Thought content normal.        Judgment: Judgment normal.    No results found.  Assessment/plan: Shirley Carr is a 33 y.o. female present for CPE with compliance chronic condition management appointments Routine general medical examination at a health care facility (Primary) Patient was encouraged to exercise greater than 150 minutes a week. Patient was encouraged to choose a diet filled with fresh fruits and vegetables, and lean meats. AVS provided to patient today for education/recommendation on gender specific health and safety maintenance. Colonoscopy: 04/06/2020, Dr. Leigh.  5-year follow-up. Mammogram: no fhx.  Routine screening at 41 Cervical cancer screening: last pap: 2024,completed by:Dr. Latisha.  Requested records Immunizations: tdap UTD 2018,Influenza declined (encouraged yearly) Infectious disease screening: HIV and  Hep C screen completed DEXA:per rheumatology recs  BMI 45.0-49.9, adult (HCC)/diabetes screening/lipid screening - Hemoglobin A1c - Lipid panel  Anxiety/panic Stable Continue Cymbalta  40 mg daily  Vitamin D  deficiency/B12 deficiency/folate  deficiency: Supplementing folate, vitamin D  and B12. Levels improved with supplementation, Vitamin D : 22.09> 40.6 ->collected today B12: 201> 533> collected today Folate: 5.7> 17.2 > collected today  Other systemic lupus erythematosus with other organ involvement (HCC)/Rheumatoid arthritis, involving unspecified site, unspecified whether rheumatoid factor present (HCC)/Lupus anticoagulant syndrome (HCC)/Immune thrombocytopenic purpura (HCC)/CVID (common variable immunodeficiency) (HCC) Managed by rheum and allergy Prescribed Plaquenil  Hypothyroidism: Continue levo 50 mcg. refills will be provided in appropriate dose based on lab result today TSH and T4 free collected today Could consider cytomel Goal TSH 2.56 or less 33 year old female.  H/O Fatigue Fatigue could be secondary to her chronic conditions of lupus, CVID or new conditions of sleep apnea.   - IBC + Ferritin-within normal limits, low normal - PTH, Intact and Calcium-WNL - CBC w/Diff-WNL - Angiotensin converting enzyme-WNL - Sedimentation rate-WNL (very mild elevation in the past on 2 occasions, normalized)  OSA on cpap- total AHI 18.7/hr and O2 nadir 89%. AutoPAP  Elevated BP - low sodium diet,  hydrate.  1 mo follow up w/ provider Discussed possibly of starting medication if BP > 130/80 to help protect kidneys with her lupus history.    Return in about 24 weeks (around 01/09/2024) for Routine chronic condition follow-up.   Orders Placed This Encounter  Procedures   Hemoglobin A1c   Lipid panel   TSH   T4, free   B12 and Folate Panel   Vitamin D  (25 hydroxy)   Meds ordered this encounter  Medications   DULoxetine  (CYMBALTA ) 20 MG capsule    Sig: Take 2 capsules (40 mg total) by mouth daily.    Dispense:  180 capsule    Refill:  1   Referral Orders  No referral(s) requested today     Note is dictated utilizing voice recognition software. Although note has been proof read prior to signing, occasional  typographical errors still can be missed. If any questions arise, please do not hesitate to call for verification.  Electronically signed by: Charlies Bellini, DO [facility]

## 2023-07-25 NOTE — Patient Instructions (Addendum)
 Return in about 24 weeks (around 01/09/2024) for Routine chronic condition follow-up. Also 1 month for elevated bp with provider       Great to see you today.  I have refilled the medication(s) we provide.   If labs were collected or images ordered, we will inform you of  results once we have received them and reviewed. We will contact you either by echart message, or telephone call.  Please give ample time to the testing facility, and our office to run,  receive and review results. Please do not call inquiring of results, even if you can see them in your chart. We will contact you as soon as we are able. If it has been over 1 week since the test was completed, and you have not yet heard from us , then please call us .    - echart message- for normal results that have been seen by the patient already.   - telephone call: abnormal results or if patient has not viewed results in their echart.  If a referral to a specialist was entered for you, please call us  in 2 weeks if you have not heard from the specialist office to schedule.

## 2023-07-26 ENCOUNTER — Ambulatory Visit: Payer: Self-pay | Admitting: Family Medicine

## 2023-07-26 DIAGNOSIS — G4733 Obstructive sleep apnea (adult) (pediatric): Secondary | ICD-10-CM | POA: Diagnosis not present

## 2023-07-26 MED ORDER — LEVOTHYROXINE SODIUM 50 MCG PO TABS: 25.0000 ug | ORAL_TABLET | Freq: Every day | ORAL | 3 refills | Status: AC

## 2023-07-31 DIAGNOSIS — D839 Common variable immunodeficiency, unspecified: Secondary | ICD-10-CM | POA: Diagnosis not present

## 2023-07-31 DIAGNOSIS — M329 Systemic lupus erythematosus, unspecified: Secondary | ICD-10-CM | POA: Diagnosis not present

## 2023-07-31 DIAGNOSIS — Z862 Personal history of diseases of the blood and blood-forming organs and certain disorders involving the immune mechanism: Secondary | ICD-10-CM | POA: Diagnosis not present

## 2023-07-31 DIAGNOSIS — Z79899 Other long term (current) drug therapy: Secondary | ICD-10-CM | POA: Diagnosis not present

## 2023-08-01 DIAGNOSIS — G4733 Obstructive sleep apnea (adult) (pediatric): Secondary | ICD-10-CM | POA: Diagnosis not present

## 2023-08-06 NOTE — Progress Notes (Unsigned)
 Follow Up Note  RE: Shirley Carr MRN: [id number] DOB: [birth year] Date of Office Visit: 08/07/2023  Referring provider: Catherine Charlies LABOR, DO Primary care provider: Catherine Charlies LABOR, DO  Chief Complaint: No chief complaint on file.  History of Present Illness: I had the pleasure of seeing Shirley Carr for a follow up visit at the [facility] on 08/07/2023. She is a 33 y.o. female, who is being followed for CVID on IVIG. Her previous allergy office visit was on 02/06/2023 with Dr. Luke. Today is a regular follow up visit.  Discussed the use of AI scribe software for clinical note transcription with the patient, who gave verbal consent to proceed.  History of Present Illness            ***  Assessment and Plan: Shirley Carr is a 33 y.o. female with: CVID (common variable immunodeficiency) (HCC) Past history - Diagnosed with CVID in 2020 and has been on Hizentra  13gm weekly (6 injection sites). Initially had some fatigue and headaches. Last injection on 06/22/2019. Pretreatment levels on 07/09/2018 IgG 459, IgA <40, IgM 28. History of frequent sinusitis, 1 pneumonia, ear infections, meningitis, foot infection requiring hospitalization. 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 History of ITP in [address], diagnosed with lupus and on Plaquenil. No history of blood clots. Covid-19 in 2022. Hyquvia caused localized irritation. On IVIG since March 2023. CT chest in 2023 showed stable axillary adenopathy b/l - most likely reactive and benign. 2024 normal spirometry.  Interim history - tolerating IVIG at infusion center with no issues. No infections/antibiotics. 2024 labs normal.  Continue IV infusions every 4 weeks. Get bloodwork every 6 months - due in April Get before you are due for your infusion in April. Keep track of infections. Normal spirometry today - repeat once a year.  Assessment and Plan              No follow-ups on file.  No orders of the defined  types were placed in this encounter.  Lab Orders  No laboratory test(s) ordered today    Diagnostics: Spirometry:  Tracings reviewed. Her effort: {Blank single:19197::Good reproducible efforts.,It was hard to get consistent efforts and there is a question as to whether this reflects a maximal maneuver.,Poor effort, data can not be interpreted.} FVC: ***L FEV1: ***L, ***% predicted FEV1/FVC ratio: ***% Interpretation: {Blank single:19197::Spirometry consistent with mild obstructive disease,Spirometry consistent with moderate obstructive disease,Spirometry consistent with severe obstructive disease,Spirometry consistent with possible restrictive disease,Spirometry consistent with mixed obstructive and restrictive disease,Spirometry uninterpretable due to technique,Spirometry consistent with normal pattern,No overt abnormalities noted given today's efforts}.  Please see scanned spirometry results for details.  Skin Testing: {Blank single:19197::Select foods,Environmental allergy panel,Environmental allergy panel and select foods,Food allergy panel,None,Deferred due to recent antihistamines use}. *** Results discussed with patient/family.   Medication List:  Current Outpatient Medications  Medication Sig Dispense Refill   cyanocobalamin  (VITAMIN B12) 1000 MCG/ML injection Inject 1 mL (1,000 mcg total) into the muscle every 14 (fourteen) days. 2 mL 11   DULoxetine  (CYMBALTA ) 20 MG capsule Take 2 capsules (40 mg total) by mouth daily. 180 capsule 1   hydroxychloroquine (PLAQUENIL) 200 MG tablet Take 400 mg by mouth daily.     Immune Globulin 10% 10G/177mL (10,000mg /139mL) SOLN Inject into the vein.     JENCYCLA 0.35 MG tablet Take 1 tablet by mouth daily.     levothyroxine  (SYNTHROID ) 50 MCG tablet Take 0.5 tablets (25 mcg total) by mouth daily. 90 tablet 3  SYRINGE-NEEDLE, DISP, 3 ML 25G X 1 3 ML MISC Inject 1 mL (1,000 mcg total) of B12 into the muscle  every 14 (fourteen) days. 2 each 11   Current Facility-Administered Medications  Medication Dose Route Frequency Provider Last Rate Last Admin   cyanocobalamin  (VITAMIN B12) injection 1,000 mcg  1,000 mcg Intramuscular Q14 Days Kuneff, Renee A, DO   1,000 mcg at 01/04/23 1148   Allergies: Allergies  Allergen Reactions   Pseudoephedrine Other (See Comments)    Joint swelling   I reviewed her past medical history, social history, family history, and environmental history and no significant changes have been reported from her previous visit.  Review of Systems  Constitutional:  Negative for appetite change, chills, fever and unexpected weight change.  HENT:  Negative for congestion, nosebleeds, postnasal drip and rhinorrhea.   Eyes:  Negative for itching.  Respiratory:  Negative for cough, chest tightness, shortness of breath and wheezing.   Cardiovascular:  Negative for chest pain.  Gastrointestinal:  Negative for abdominal pain.  Genitourinary:  Negative for difficulty urinating.  Skin:  Negative for rash.  Allergic/Immunologic: Negative for environmental allergies and food allergies.  Neurological:  Negative for headaches.    Objective: LMP 06/05/2023  There is no height or weight on file to calculate BMI. Physical Exam Vitals and nursing note reviewed.  Constitutional:      Appearance: Normal appearance. She is well-developed. She is obese.  HENT:     Head: Normocephalic and atraumatic.     Right Ear: Tympanic membrane and external ear normal.     Left Ear: Tympanic membrane and external ear normal.     Nose: Nose normal.     Mouth/Throat:     Mouth: Mucous membranes are moist.     Pharynx: Oropharynx is clear.  Eyes:     Conjunctiva/sclera: Conjunctivae normal.  Cardiovascular:     Rate and Rhythm: Normal rate and regular rhythm.     Heart sounds: Normal heart sounds. No murmur heard.    No friction rub. No gallop.  Pulmonary:     Effort: Pulmonary effort is normal.      Breath sounds: Normal breath sounds. No wheezing, rhonchi or rales.  Abdominal:     Palpations: Abdomen is soft.  Musculoskeletal:     Cervical back: Neck supple.  Skin:    General: Skin is warm.     Findings: No bruising or rash.  Neurological:     Mental Status: She is alert and oriented to person, place, and time.  Psychiatric:        Mood and Affect: Mood normal.        Behavior: Behavior normal.    Previous notes and tests were reviewed. The plan was reviewed with the patient/family, and all questions/concerned were addressed.  It was my pleasure to see Shirley Carr today and participate in her care. Please feel free to contact me with any questions or concerns.  Sincerely,  Orlan Cramp, DO Allergy & Immunology  [facility]  [place] office: [phone] [facility] office: [phone]

## 2023-08-07 ENCOUNTER — Ambulatory Visit (INDEPENDENT_AMBULATORY_CARE_PROVIDER_SITE_OTHER): Payer: BC Managed Care – PPO | Admitting: Allergy

## 2023-08-07 ENCOUNTER — Encounter: Payer: Self-pay | Admitting: Allergy

## 2023-08-07 VITALS — BP 122/84 | HR 87 | Temp 98.3°F | Wt 337.5 lb

## 2023-08-07 DIAGNOSIS — D839 Common variable immunodeficiency, unspecified: Secondary | ICD-10-CM

## 2023-08-07 NOTE — Patient Instructions (Addendum)
 CVID Continue IV infusions every 4 weeks. Get bloodwork every 6 months - due in October. Get before you are due for your infusion in October.  Keep track of infections. Will get breathing test once a year - due next time.   Follow up in 6 months or sooner if needed.

## 2023-08-10 DIAGNOSIS — D839 Common variable immunodeficiency, unspecified: Secondary | ICD-10-CM | POA: Diagnosis not present

## 2023-08-21 DIAGNOSIS — E162 Hypoglycemia, unspecified: Secondary | ICD-10-CM | POA: Diagnosis not present

## 2023-08-21 DIAGNOSIS — E039 Hypothyroidism, unspecified: Secondary | ICD-10-CM | POA: Diagnosis not present

## 2023-08-21 DIAGNOSIS — G4733 Obstructive sleep apnea (adult) (pediatric): Secondary | ICD-10-CM | POA: Diagnosis not present

## 2023-08-21 DIAGNOSIS — Z1389 Encounter for screening for other disorder: Secondary | ICD-10-CM | POA: Diagnosis not present

## 2023-08-21 DIAGNOSIS — E538 Deficiency of other specified B group vitamins: Secondary | ICD-10-CM | POA: Diagnosis not present

## 2023-08-21 DIAGNOSIS — R7301 Impaired fasting glucose: Secondary | ICD-10-CM | POA: Diagnosis not present

## 2023-08-21 DIAGNOSIS — M329 Systemic lupus erythematosus, unspecified: Secondary | ICD-10-CM | POA: Diagnosis not present

## 2023-08-27 ENCOUNTER — Ambulatory Visit: Admitting: Family Medicine

## 2023-09-04 DIAGNOSIS — E669 Obesity, unspecified: Secondary | ICD-10-CM | POA: Diagnosis not present

## 2023-09-04 DIAGNOSIS — E039 Hypothyroidism, unspecified: Secondary | ICD-10-CM | POA: Diagnosis not present

## 2023-09-04 DIAGNOSIS — E88819 Insulin resistance, unspecified: Secondary | ICD-10-CM | POA: Diagnosis not present

## 2023-09-04 DIAGNOSIS — G4733 Obstructive sleep apnea (adult) (pediatric): Secondary | ICD-10-CM | POA: Diagnosis not present

## 2023-09-07 DIAGNOSIS — D839 Common variable immunodeficiency, unspecified: Secondary | ICD-10-CM | POA: Diagnosis not present

## 2023-09-18 DIAGNOSIS — E039 Hypothyroidism, unspecified: Secondary | ICD-10-CM | POA: Diagnosis not present

## 2023-09-18 DIAGNOSIS — G4733 Obstructive sleep apnea (adult) (pediatric): Secondary | ICD-10-CM | POA: Diagnosis not present

## 2023-09-18 DIAGNOSIS — E88819 Insulin resistance, unspecified: Secondary | ICD-10-CM | POA: Diagnosis not present

## 2023-09-18 DIAGNOSIS — E669 Obesity, unspecified: Secondary | ICD-10-CM | POA: Diagnosis not present

## 2023-09-21 ENCOUNTER — Other Ambulatory Visit: Payer: Self-pay | Admitting: Family Medicine

## 2023-09-24 NOTE — Telephone Encounter (Signed)
 Her last visit labs indicated that her B12 was well supplemented with the injections. She could consider continuing every 2 weeks or attempt to stretch out every 3-4 weeks.

## 2023-10-05 DIAGNOSIS — D839 Common variable immunodeficiency, unspecified: Secondary | ICD-10-CM | POA: Diagnosis not present

## 2023-10-06 LAB — COMPREHENSIVE METABOLIC PANEL WITH GFR
ALT: 20 IU/L (ref 0–32)
AST: 22 IU/L (ref 0–40)
Albumin: 4.6 g/dL (ref 3.9–4.9)
Alkaline Phosphatase: 64 IU/L (ref 41–116)
BUN/Creatinine Ratio: 15 (ref 9–23)
BUN: 12 mg/dL (ref 6–20)
Bilirubin Total: 0.3 mg/dL (ref 0.0–1.2)
CO2: 25 mmol/L (ref 20–29)
Calcium: 10 mg/dL (ref 8.7–10.2)
Chloride: 101 mmol/L (ref 96–106)
Creatinine, Ser: 0.78 mg/dL (ref 0.57–1.00)
Globulin, Total: 2.2 g/dL (ref 1.5–4.5)
Glucose: 70 mg/dL (ref 70–99)
Potassium: 4.8 mmol/L (ref 3.5–5.2)
Sodium: 141 mmol/L (ref 134–144)
Total Protein: 6.8 g/dL (ref 6.0–8.5)
eGFR: 104 mL/min/1.73 (ref >59)

## 2023-10-06 LAB — CBC WITH DIFFERENTIAL/PLATELET
Basophils Absolute: 0 x10E3/uL (ref 0.0–0.2)
Basos: 0 %
EOS (ABSOLUTE): 0.1 x10E3/uL (ref 0.0–0.4)
Eos: 1 %
Hematocrit: 42.7 % (ref 34.0–46.6)
Hemoglobin: 13.7 g/dL (ref 11.1–15.9)
Immature Grans (Abs): 0 x10E3/uL (ref 0.0–0.1)
Immature Granulocytes: 0 %
Lymphocytes Absolute: 2 x10E3/uL (ref 0.7–3.1)
Lymphs: 26 %
MCH: 28.2 pg (ref 26.6–33.0)
MCHC: 32.1 g/dL (ref 31.5–35.7)
MCV: 88 fL (ref 79–97)
Monocytes Absolute: 0.6 x10E3/uL (ref 0.1–0.9)
Monocytes: 8 %
Neutrophils Absolute: 4.8 x10E3/uL (ref 1.4–7.0)
Neutrophils: 65 %
Platelets: 262 x10E3/uL (ref 150–450)
RBC: 4.85 x10E6/uL (ref 3.77–5.28)
RDW: 12.9 % (ref 11.7–15.4)
WBC: 7.6 x10E3/uL (ref 3.4–10.8)

## 2023-10-06 LAB — SEDIMENTATION RATE: Sed Rate: 17 mm/h (ref 0–32)

## 2023-10-06 LAB — IGG, IGA, IGM
IgA/Immunoglobulin A, Serum: 17 mg/dL — ABNORMAL LOW (ref 87–352)
IgG (Immunoglobin G), Serum: 1048 mg/dL (ref 586–1602)
IgM (Immunoglobulin M), Srm: 25 mg/dL — ABNORMAL LOW (ref 26–217)

## 2023-10-07 ENCOUNTER — Ambulatory Visit: Payer: Self-pay | Admitting: Allergy

## 2023-11-02 DIAGNOSIS — D839 Common variable immunodeficiency, unspecified: Secondary | ICD-10-CM | POA: Diagnosis not present

## 2023-11-20 DIAGNOSIS — G4733 Obstructive sleep apnea (adult) (pediatric): Secondary | ICD-10-CM | POA: Diagnosis not present

## 2023-11-20 DIAGNOSIS — E88819 Insulin resistance, unspecified: Secondary | ICD-10-CM | POA: Diagnosis not present

## 2023-11-20 DIAGNOSIS — E669 Obesity, unspecified: Secondary | ICD-10-CM | POA: Diagnosis not present

## 2023-11-20 DIAGNOSIS — E039 Hypothyroidism, unspecified: Secondary | ICD-10-CM | POA: Diagnosis not present

## 2023-12-04 DIAGNOSIS — D839 Common variable immunodeficiency, unspecified: Secondary | ICD-10-CM | POA: Diagnosis not present

## 2023-12-05 ENCOUNTER — Encounter: Payer: Self-pay | Admitting: Family Medicine

## 2023-12-06 ENCOUNTER — Telehealth: Payer: Self-pay | Admitting: Family Medicine

## 2023-12-06 NOTE — Telephone Encounter (Signed)
 Patient stated, cancelling appointment because do not need the appointment right now

## 2023-12-10 ENCOUNTER — Ambulatory Visit: Payer: BC Managed Care – PPO | Admitting: Family Medicine

## 2024-01-24 ENCOUNTER — Other Ambulatory Visit: Payer: Self-pay | Admitting: Family Medicine

## 2024-02-05 ENCOUNTER — Ambulatory Visit: Admitting: Family Medicine

## 2024-02-07 ENCOUNTER — Other Ambulatory Visit: Payer: Self-pay

## 2024-02-07 ENCOUNTER — Encounter: Payer: Self-pay | Admitting: Allergy

## 2024-02-07 ENCOUNTER — Ambulatory Visit: Admitting: Allergy

## 2024-02-07 VITALS — BP 126/68 | HR 94 | Temp 97.9°F | Resp 18 | Ht 68.0 in | Wt 319.4 lb

## 2024-02-07 DIAGNOSIS — D839 Common variable immunodeficiency, unspecified: Secondary | ICD-10-CM

## 2024-02-07 NOTE — Progress Notes (Signed)
 "  Follow Up Note  RE: Shirley Carr MRN: [id number] DOB: 1990-11-09 Date of Office Visit: 02/07/2024  Referring provider: Catherine Charlies LABOR, DO Primary care provider: Catherine Charlies LABOR, DO  Chief Complaint: Follow-up (Follow up doing well, no refills needed at this time)  History of Present Illness: I had the pleasure of seeing Shirley Carr for a follow up visit at the [facility] on 02/07/2024. She is a 34 y.o. female, who is being followed for CVID on IVIG infusions. Her previous allergy office visit was on 08/07/2023 with Dr. Luke. Today is a regular follow up visit.  Discussed the use of AI scribe software for clinical note transcription with the patient, who gave verbal consent to proceed.    She has not experienced any recent infections or required antibiotics. She continues to receive her IVIG infusions every four weeks at [facility] without complications. Mild fatigue occurs the day after infusions, but otherwise, she feels well. No fever, chills, lymphadenopathy, bleeding, or breathing difficulties are reported.  She has started attending a weight clinic to address joint issues. She follows an 1800 calorie diet and tracks her intake. She is prescribed a low dose of Zepbound, which she tolerates well. Additionally, she takes Plaquenil for her rheumatologic condition.  Her current medications include Cymbalta , vitamin B, birth control, and Synthroid .  She has a family history of precancerous polyps in her mother, prompting her to undergo colonoscopies earlier than standard recommendations.  She has not traveled for the holidays and has not experienced flu or COVID-19. She does not routinely receive flu shots or COVID vaccines.     Assessment and Plan: Shirley Carr is a 34 y.o. female with: CVID (common variable immunodeficiency) (HCC) Past history - Diagnosed with CVID in 2020 and has been on Hizentra  13gm weekly (6 injection sites). Initially had some  fatigue and headaches. Last injection on 06/22/2019. Pretreatment levels on 07/09/2018 IgG 459, IgA <40, IgM 28. History of frequent sinusitis, 1 pneumonia, ear infections, meningitis, foot infection requiring hospitalization. 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 History [place] ITP in [address], diagnosed with lupus and on Plaquenil. No history of blood clots. Covid-19 in 2022. Hyquvia caused localized irritation. On IVIG since March 2023. CT chest in 2023 showed stable axillary adenopathy b/l - most likely reactive and benign. 2024 normal spirometry.  Interim history - tolerating IVIG at infusion center with no issues. No infections/antibiotics. 2025 labs normal. Mild post-infusion fatigue noted. Normal spirometry today. Get bloodwork every 6 months - due in April. Get before you are due for your infusion in April. Keep track of infections. Will get breathing test once a year.   Return in about 6 months (around 08/06/2024).  No orders of the defined types were placed in this encounter.  Lab Orders         IgG, IgA, IgM         Sedimentation rate         CBC with Differential/Platelet         Comprehensive metabolic panel with GFR      Diagnostics: Spirometry:  Tracings reviewed. Her effort: Good reproducible efforts. FVC: 4.28L FEV1: 3.52L, 97% predicted FEV1/FVC ratio: 82% Interpretation: Spirometry consistent with normal pattern.  Please see scanned spirometry results for details.  Results discussed with patient/family.   Medication List:  Current Outpatient Medications  Medication Sig Dispense Refill   cyanocobalamin  (VITAMIN B12) 1000 MCG/ML injection INJECT 1 ML (1,000 MCG TOTAL) INTO THE MUSCLE EVERY  14 (FOURTEEN) DAYS. 6 mL 3   DULoxetine  (CYMBALTA ) 20 MG capsule TAKE 2 CAPSULES DAILY 60 capsule 0   hydroxychloroquine (PLAQUENIL) 200 MG tablet Take 400 mg by mouth daily.     Immune Globulin 10% 10G/118mL (10,000mg /154mL) SOLN Inject into the vein.     JENCYCLA 0.35 MG  tablet Take 1 tablet by mouth daily.     levothyroxine  (SYNTHROID ) 50 MCG tablet Take 0.5 tablets (25 mcg total) by mouth daily. 90 tablet 3   SYRINGE-NEEDLE, DISP, 3 ML (B-D 3CC LUER-LOK SYR 25GX1) 25G X 1 3 ML MISC INJECT 1 ML (1000 MCG TOTAL) OF B12 INTO THE MUSCLE EVERY 14 DAYS 2 each 12   Current Facility-Administered Medications  Medication Dose Route Frequency Provider Last Rate Last Admin   cyanocobalamin  (VITAMIN B12) injection 1,000 mcg  1,000 mcg Intramuscular Q14 Days Kuneff, Renee A, DO   1,000 mcg at 01/04/23 1148   Allergies: Allergies[1] I reviewed her past medical history, social history, family history, and environmental history and no significant changes have been reported from her previous visit.  Review of Systems  Constitutional:  Negative for appetite change, chills, fever and unexpected weight change.  HENT:  Negative for congestion, nosebleeds, postnasal drip and rhinorrhea.   Eyes:  Negative for itching.  Respiratory:  Negative for cough, chest tightness, shortness of breath and wheezing.   Cardiovascular:  Negative for chest pain.  Gastrointestinal:  Negative for abdominal pain.  Genitourinary:  Negative for difficulty urinating.  Skin:  Negative for rash.  Allergic/Immunologic: Negative for environmental allergies and food allergies.  Neurological:  Negative for headaches.    Objective: BP 126/68   Pulse 94   Temp 97.9 F (36.6 C) (Temporal)   Resp 18   Ht 5' 8 (1.727 m)   Wt (!) 319 lb 6.4 oz (144.9 kg)   SpO2 97%   BMI 48.56 kg/m  Body mass index is 48.56 kg/m. Physical Exam Vitals and nursing note reviewed.  Constitutional:      Appearance: Normal appearance. She is well-developed. She is obese.  HENT:     Head: Normocephalic and atraumatic.     Right Ear: Tympanic membrane and external ear normal.     Left Ear: Tympanic membrane and external ear normal.     Nose: Nose normal.     Mouth/Throat:     Mouth: Mucous membranes are moist.      Pharynx: Oropharynx is clear.  Eyes:     Conjunctiva/sclera: Conjunctivae normal.  Cardiovascular:     Rate and Rhythm: Normal rate and regular rhythm.     Heart sounds: Normal heart sounds. No murmur heard.    No friction rub. No gallop.  Pulmonary:     Effort: Pulmonary effort is normal.     Breath sounds: Normal breath sounds. No wheezing, rhonchi or rales.  Abdominal:     Palpations: Abdomen is soft.  Musculoskeletal:     Cervical back: Neck supple.  Skin:    General: Skin is warm.     Findings: No bruising or rash.  Neurological:     Mental Status: She is alert and oriented to person, place, and time.  Psychiatric:        Mood and Affect: Mood normal.        Behavior: Behavior normal.    Previous notes and tests were reviewed. The plan was reviewed with the patient/family, and all questions/concerned were addressed.  It was my pleasure to see Shirley Carr today and participate in her  care. Please feel free to contact me with any questions or concerns.  Sincerely,  Orlan Cramp, DO Allergy & Immunology  [facility]  [facility] office: [phone] [facility] office: [phone]     [1]  Allergies Allergen Reactions   Pseudoephedrine Other (See Comments)    Joint swelling   "

## 2024-02-07 NOTE — Patient Instructions (Addendum)
 CVID Continue IV infusions every 4 weeks. Get bloodwork every 6 months - due in April. Get before you are due for your infusion in April. Keep track of infections.  Follow up in 6 months or sooner if needed.

## 2024-02-13 ENCOUNTER — Ambulatory Visit: Admitting: Family Medicine

## 2024-08-07 ENCOUNTER — Ambulatory Visit: Admitting: Allergy
